# Patient Record
Sex: Female | Born: 1995 | Race: White | Hispanic: No | Marital: Married | Smoking: Current every day smoker
Health system: Southern US, Community
[De-identification: ages and names within clinical notes are randomized; demographics above are authoritative.]

## PROBLEM LIST (undated history)

## (undated) ENCOUNTER — Inpatient Hospital Stay (HOSPITAL_COMMUNITY): Payer: Self-pay

## (undated) DIAGNOSIS — E039 Hypothyroidism, unspecified: Secondary | ICD-10-CM

## (undated) DIAGNOSIS — O1490 Unspecified pre-eclampsia, unspecified trimester: Secondary | ICD-10-CM

## (undated) DIAGNOSIS — K529 Noninfective gastroenteritis and colitis, unspecified: Secondary | ICD-10-CM

## (undated) DIAGNOSIS — F419 Anxiety disorder, unspecified: Secondary | ICD-10-CM

## (undated) DIAGNOSIS — K2970 Gastritis, unspecified, without bleeding: Secondary | ICD-10-CM

## (undated) DIAGNOSIS — K589 Irritable bowel syndrome without diarrhea: Secondary | ICD-10-CM

## (undated) DIAGNOSIS — E876 Hypokalemia: Secondary | ICD-10-CM

## (undated) HISTORY — DX: Gastritis, unspecified, without bleeding: K29.70

## (undated) HISTORY — DX: Hypothyroidism, unspecified: E03.9

## (undated) HISTORY — PX: CHOLECYSTECTOMY: SHX55

## (undated) HISTORY — PX: APPENDECTOMY: SHX54

## (undated) HISTORY — DX: Irritable bowel syndrome, unspecified: K58.9

## (undated) HISTORY — DX: Noninfective gastroenteritis and colitis, unspecified: K52.9

## (undated) HISTORY — DX: Unspecified pre-eclampsia, unspecified trimester: O14.90

## (undated) HISTORY — DX: Anxiety disorder, unspecified: F41.9

---

## 2015-12-27 LAB — OB RESULTS CONSOLE GC/CHLAMYDIA
CHLAMYDIA, DNA PROBE: NEGATIVE
GC PROBE AMP, GENITAL: NEGATIVE

## 2016-01-16 ENCOUNTER — Encounter: Payer: Self-pay | Admitting: *Deleted

## 2016-01-16 NOTE — Progress Notes (Signed)
Patient had viability scan at [facility].  No dating criteria on U/S.

## 2016-01-27 LAB — OB RESULTS CONSOLE GC/CHLAMYDIA
CHLAMYDIA, DNA PROBE: NEGATIVE
Gonorrhea: NEGATIVE

## 2016-01-29 ENCOUNTER — Encounter: Payer: Self-pay | Admitting: Advanced Practice Midwife

## 2016-01-29 ENCOUNTER — Ambulatory Visit (INDEPENDENT_AMBULATORY_CARE_PROVIDER_SITE_OTHER): Payer: Self-pay | Admitting: Advanced Practice Midwife

## 2016-01-29 VITALS — BP 132/81 | HR 81 | Temp 98.4°F | Ht 63.0 in | Wt 124.6 lb

## 2016-01-29 DIAGNOSIS — Z720 Tobacco use: Secondary | ICD-10-CM | POA: Insufficient documentation

## 2016-01-29 DIAGNOSIS — O09293 Supervision of pregnancy with other poor reproductive or obstetric history, third trimester: Secondary | ICD-10-CM

## 2016-01-29 DIAGNOSIS — K589 Irritable bowel syndrome without diarrhea: Secondary | ICD-10-CM | POA: Insufficient documentation

## 2016-01-29 DIAGNOSIS — F411 Generalized anxiety disorder: Secondary | ICD-10-CM

## 2016-01-29 DIAGNOSIS — Z8751 Personal history of pre-term labor: Secondary | ICD-10-CM

## 2016-01-29 DIAGNOSIS — E039 Hypothyroidism, unspecified: Secondary | ICD-10-CM

## 2016-01-29 DIAGNOSIS — Z3492 Encounter for supervision of normal pregnancy, unspecified, second trimester: Secondary | ICD-10-CM

## 2016-01-29 DIAGNOSIS — Z3490 Encounter for supervision of normal pregnancy, unspecified, unspecified trimester: Secondary | ICD-10-CM

## 2016-01-29 DIAGNOSIS — O09299 Supervision of pregnancy with other poor reproductive or obstetric history, unspecified trimester: Secondary | ICD-10-CM | POA: Insufficient documentation

## 2016-01-29 LAB — POCT URINALYSIS DIP (DEVICE)
BILIRUBIN URINE: NEGATIVE
GLUCOSE, UA: NEGATIVE mg/dL
KETONES UR: NEGATIVE mg/dL
LEUKOCYTES UA: NEGATIVE
Nitrite: NEGATIVE
Protein, ur: NEGATIVE mg/dL
UROBILINOGEN UA: 0.2 mg/dL (ref 0.0–1.0)
pH: 5.5 (ref 5.0–8.0)

## 2016-01-29 LAB — TSH: TSH: 0.92 mIU/L (ref 0.50–4.30)

## 2016-01-29 NOTE — Progress Notes (Signed)
New OB, see Smart Set  Subjective:    Misty Mills is a G2P0101 [redacted]w[redacted]d being seen today for her first obstetrical visit.  Her obstetrical history is significant for smoker and Hx preeclampsia, hx PTL, hypothyroidism, Panic disorder, IBS, hx preterm birth due to preeclampsia. Patient does intend to breast feed. Pregnancy history fully reviewed.  Patient reports no complaints.  Filed Vitals:   01/29/16 1459 01/29/16 1500  BP: 132/81   Pulse: 81   Temp: 98.4 F (36.9 C)   Height:   (1.6 m)  Weight: 124 lb 9.6 oz (56.518 kg)     HISTORY: OB History  Gravida Para Term Preterm AB SAB TAB Ectopic Multiple Living  # Outcome Date GA Lbr Len/2nd Weight Sex Delivery Anes PTL Lv  2 Current           1 Preterm 11/05/14 [redacted]w[redacted]d  4 lb 11 oz (2.126 kg) F Vag-Spont EPI Y Y     Comments: preterm delivery; Precclampsia- born at [facility]     Past Medical History  Diagnosis Date  . Hypothyroidism   . Colitis   . Gastritis   . Preeclampsia   . Irritable bowel   . Preterm labor   . Anxiety    Past Surgical History  Procedure Laterality Date  . Appendectomy     Family History  Problem Relation Age of Onset  . Hypothyroidism Mother   . Hypertension Mother   . Mental illness Father      Exam    Uterus:  Fundal Height: 14 cm  Pelvic Exam:    Perineum: Normal Perineum   Vulva: Bartholin's, Urethra, Skene's normal   Vagina:  normal discharge   pH:    Cervix: no cervical motion tenderness   Adnexa: no mass, fullness, tenderness   Bony Pelvis: gynecoid  System: Breast:  normal appearance, no masses or tenderness   Skin: normal coloration and turgor, no rashes    Neurologic: oriented, grossly non-focal   Extremities: normal strength, tone, and muscle mass   HEENT neck supple with midline trachea   Mouth/Teeth mucous membranes moist, pharynx normal without lesions   Neck supple and no masses   Cardiovascular: regular rate and rhythm   Respiratory:  appears well, vitals normal, no respiratory distress, acyanotic, normal RR, ear and throat exam is normal   Abdomen: soft, non-tender; bowel sounds normal; no masses,  no organomegaly   Urinary: urethral meatus normal      Assessment:    Pregnancy: G2P0101 Patient Active Problem List   Diagnosis Date Noted  . IBS (irritable bowel syndrome) 01/29/2016  . Hypothyroidism 01/29/2016  . Generalized anxiety disorder 01/29/2016  . Tobacco use 01/29/2016  . Hx of preeclampsia, prior pregnancy, currently pregnant 01/29/2016  . History of pre-term labor 01/29/2016        Plan:     Initial labs drawn. Prenatal vitamins. Problem list reviewed and updated. Genetic Screening discussed Quad Screen: declined.  Ultrasound discussed; fetal survey: ordered.  Follow up in 4 weeks. 50% of 30 min visit spent on counseling and coordination of care.   Routines reviewed Will add TSH to labs Informed may need to increase Synthroid Forgot to tell her about 24 hr urine before she left, will have RN call her Form filled out for Diclegis GC/Chl and wet prep done at other hospital and were negative    [facility] 01/29/2016

## 2016-01-29 NOTE — Patient Instructions (Signed)

## 2016-01-29 NOTE — Progress Notes (Signed)
Here for first visit. Declines flu shot. Declined quad screen.

## 2016-01-30 ENCOUNTER — Telehealth: Payer: Self-pay | Admitting: General Practice

## 2016-01-30 LAB — CULTURE, OB URINE
Colony Count: NO GROWTH
ORGANISM ID, BACTERIA: NO GROWTH

## 2016-01-30 NOTE — Telephone Encounter (Signed)
Per Wynelle Bourgeois, patient needs 24 hr urine & cmet when she brings urine back due to pre-e with last pregnancy. Called patient, no answer- left message stating we are trying to reach you in regards to an appt, please call us back at the clinics

## 2016-01-31 ENCOUNTER — Encounter: Payer: Self-pay | Admitting: Advanced Practice Midwife

## 2016-01-31 ENCOUNTER — Encounter: Payer: Self-pay | Admitting: *Deleted

## 2016-01-31 DIAGNOSIS — R899 Unspecified abnormal finding in specimens from other organs, systems and tissues: Secondary | ICD-10-CM | POA: Insufficient documentation

## 2016-01-31 LAB — PRENATAL PROFILE (SOLSTAS)
ANTIBODY SCREEN: NEGATIVE
BASOS ABS: 0 10*3/uL (ref 0.0–0.1)
BASOS PCT: 0 % (ref 0–1)
EOS ABS: 0 10*3/uL (ref 0.0–0.7)
Eosinophils Relative: 0 % (ref 0–5)
HCT: 37.5 % (ref 36.0–46.0)
HEMOGLOBIN: 12.6 g/dL (ref 12.0–15.0)
HEP B S AG: NEGATIVE
HIV 1&2 Ab, 4th Generation: NONREACTIVE
LYMPHS ABS: 2.9 10*3/uL (ref 0.7–4.0)
Lymphocytes Relative: 15 % (ref 12–46)
MCH: 29.6 pg (ref 26.0–34.0)
MCHC: 33.6 g/dL (ref 30.0–36.0)
MCV: 88.2 fL (ref 78.0–100.0)
MONOS PCT: 5 % (ref 3–12)
MPV: 9.2 fL (ref 8.6–12.4)
Monocytes Absolute: 1 10*3/uL (ref 0.1–1.0)
NEUTROS ABS: 15.3 10*3/uL — AB (ref 1.7–7.7)
NEUTROS PCT: 80 % — AB (ref 43–77)
Platelets: 341 10*3/uL (ref 150–400)
RBC: 4.25 MIL/uL (ref 3.87–5.11)
RDW: 13.8 % (ref 11.5–15.5)
RPR: REACTIVE — AB
RUBELLA: 1.95 {index} — AB (ref <0.90)
Rh Type: POSITIVE
WBC: 19.1 10*3/uL — ABNORMAL HIGH (ref 4.0–10.5)

## 2016-01-31 LAB — PRESCRIPTION MONITORING PROFILE (19 PANEL)
Amphetamine/Meth: NEGATIVE ng/mL
Barbiturate Screen, Urine: NEGATIVE ng/mL
Benzodiazepine Screen, Urine: NEGATIVE ng/mL
Buprenorphine, Urine: NEGATIVE ng/mL
CREATININE, URINE: 22.29 mg/dL (ref >20.0)
Cannabinoid Scrn, Ur: NEGATIVE ng/mL
Carisoprodol, Urine: NEGATIVE ng/mL
Cocaine Metabolites: NEGATIVE ng/mL
ECSTASY: NEGATIVE ng/mL
FENTANYL URINE: NEGATIVE ng/mL
MEPERIDINE UR: NEGATIVE ng/mL
Methadone Screen, Urine: NEGATIVE ng/mL
Methaqualone: NEGATIVE ng/mL
NITRITES URINE, INITIAL: NEGATIVE ug/mL
Opiate Screen, Urine: NEGATIVE ng/mL
Oxycodone Screen, Ur: NEGATIVE ng/mL
PROPOXYPHENE: NEGATIVE ng/mL
Phencyclidine, Ur: NEGATIVE ng/mL
Tapentadol, urine: NEGATIVE ng/mL
Tramadol Scrn, Ur: NEGATIVE ng/mL
Zolpidem, Urine: NEGATIVE ng/mL
pH, Initial: 6.4 pH (ref 4.5–8.9)

## 2016-01-31 LAB — RPR TITER: RPR Titer: 1:1 {titer}

## 2016-02-02 ENCOUNTER — Other Ambulatory Visit: Payer: Self-pay | Admitting: Advanced Practice Midwife

## 2016-02-02 ENCOUNTER — Encounter: Payer: Self-pay | Admitting: Advanced Practice Midwife

## 2016-02-02 DIAGNOSIS — R899 Unspecified abnormal finding in specimens from other organs, systems and tissues: Secondary | ICD-10-CM

## 2016-02-02 DIAGNOSIS — O98112 Syphilis complicating pregnancy, second trimester: Secondary | ICD-10-CM | POA: Insufficient documentation

## 2016-02-02 LAB — FLUORESCENT TREPONEMAL AB(FTA)-IGG-BLD: FLUORESCENT TREPONEMAL ABS: REACTIVE — AB

## 2016-02-02 NOTE — Telephone Encounter (Signed)
Pt returned call and I explained to her that due to hx of pre-eclampsia then the provider has requested her to do a 24 hr urine.  Explained to the pt the procedure.  Pt stated that she will possibly be able to come in today or tomorrow for 24 hr urine supplies.

## 2016-02-03 ENCOUNTER — Other Ambulatory Visit: Payer: Medicaid Other

## 2016-02-03 ENCOUNTER — Telehealth: Payer: Self-pay

## 2016-02-03 NOTE — Telephone Encounter (Signed)
Could not put in order because it would need to be done in clinic   Dose is 2.34million units IM x 1

## 2016-02-03 NOTE — Telephone Encounter (Signed)
Left a detailed medicine stating patient needs to call us back regarding test results.

## 2016-02-03 NOTE — Telephone Encounter (Signed)
Has + RPR and FTABS   Needs PCN shot   WIll put in orders   I have called patient and left her a message to call the clinics back with important information.

## 2016-02-04 ENCOUNTER — Ambulatory Visit (INDEPENDENT_AMBULATORY_CARE_PROVIDER_SITE_OTHER): Payer: Self-pay

## 2016-02-04 ENCOUNTER — Other Ambulatory Visit: Payer: Self-pay

## 2016-02-04 VITALS — BP 125/68 | HR 91 | Temp 97.8°F

## 2016-02-04 DIAGNOSIS — O98112 Syphilis complicating pregnancy, second trimester: Secondary | ICD-10-CM

## 2016-02-04 DIAGNOSIS — Z202 Contact with and (suspected) exposure to infections with a predominantly sexual mode of transmission: Secondary | ICD-10-CM

## 2016-02-04 MED ORDER — PENICILLIN G BENZATHINE 1200000 UNIT/2ML IM SUSP
1.2000 10*6.[IU] | Freq: Once | INTRAMUSCULAR | Status: AC
  Administered 2016-02-04: 1.2 10*6.[IU] via INTRAMUSCULAR

## 2016-02-04 MED ORDER — PRENATAL VITAMINS PLUS 27-1 MG PO TABS: 1.0000 | ORAL_TABLET | Freq: Once | ORAL | Status: DC

## 2016-02-04 MED ORDER — PENICILLIN G BENZATHINE & PROC 1200000 UNIT/2ML IM SUSP: 1.2000 10*6.[IU] | Freq: Once | INTRAMUSCULAR | Status: DC

## 2016-02-04 NOTE — Telephone Encounter (Signed)
LMTCB concerning lab results.

## 2016-02-04 NOTE — Progress Notes (Signed)
Pt comes into clinics today for her 2 of 6 injection she will need to treat her positive results  for Syphilis. Per Dr.Anyanwu patients will need a dose of 2.4 for three weeks. Each injection was given at 1.2 million units in each buttocks. Pt blood pressure is stable however patient did become a little sick after getting her injection. She was encourage to sit down and wait a minute before leaving. Ice chips and ginger ale was given ton patient. Pt verbalize understanding and waited 15 minutes after injection given.

## 2016-02-05 NOTE — Telephone Encounter (Signed)
Pt has received first dose of PCN and will need to returned to clinic in 1 week. I have place a reminder call for patient to call and scheduled follow up nurse visit.

## 2016-02-06 NOTE — Telephone Encounter (Signed)
Pt has been scheduled an appt on 02/11/2016

## 2016-02-09 ENCOUNTER — Telehealth: Payer: Self-pay | Admitting: *Deleted

## 2016-02-09 ENCOUNTER — Encounter: Payer: Self-pay | Admitting: *Deleted

## 2016-02-09 NOTE — Telephone Encounter (Addendum)
Pt left message stating that she has recently been to the clinic for treatment of syphilis. Her partner was recently tested for the infection and got his results today which were negative. She wants to know how he does not have the infection since it is sexually transmitted and she knows she did not cheat with someone else. Please call back. I returned pt's call and discussed her concern. I advised that I do not have an answer to her question. Pt stated that she knows she has not been with anyone else other than her husband and he states he has not been with anyone else either. Misty Mills was present when her husband received his results today and also talked with the clinical personnel. I advised pt to discuss her concern further with a provider on 3/1 when she comes for her injection.  She agreed and voiced understanding of all information given.

## 2016-02-10 NOTE — Progress Notes (Signed)
[facility] Barr [facility] in regards to the communicable disease report that was sent.  He wanted to know how the RPR was obtained via vaginal swab.  I explained to him that the RPR was completed via blood draw and that it was a mistake on how it was written on the communicable disease report, I apologized.  I informed him that we could send the RPR results and requested that he sends a release of information.  He states that he will fax over the release of information and if we could also send H&P as well.  Front office, Erie Noe, to fax information requested via EPIC.

## 2016-02-11 ENCOUNTER — Ambulatory Visit (INDEPENDENT_AMBULATORY_CARE_PROVIDER_SITE_OTHER): Payer: Self-pay

## 2016-02-11 VITALS — BP 130/83 | HR 75 | Temp 97.9°F

## 2016-02-11 DIAGNOSIS — O98112 Syphilis complicating pregnancy, second trimester: Secondary | ICD-10-CM

## 2016-02-11 MED ORDER — PENICILLIN G BENZATHINE & PROC 900000-300000 UNIT/2ML IM SUSP: 2.4000 10*6.[IU] | Freq: Once | INTRAMUSCULAR | Status: DC

## 2016-02-11 MED ORDER — PENICILLIN G BENZATHINE 1200000 UNIT/2ML IM SUSP
1.2000 10*6.[IU] | Freq: Once | INTRAMUSCULAR | Status: AC
  Administered 2016-02-11: 1.2 10*6.[IU] via INTRAMUSCULAR

## 2016-02-11 MED ORDER — PENICILLIN G BENZATHINE 1200000 UNIT/2ML IM SUSP: 2.4000 10*6.[IU] | Freq: Once | INTRAMUSCULAR | Status: DC

## 2016-02-11 NOTE — Progress Notes (Signed)
Pt comes into clinic today for 2nd dose of Bicillin L-A given to treat her std infection. Pt was given 1.2 million units in her right and left buttocks.Pt waited 15 minutes to make sure medication was tolerated well. Pt will follow up next Wednesday for last two doses.

## 2016-02-13 ENCOUNTER — Encounter: Payer: Self-pay | Admitting: *Deleted

## 2016-02-18 ENCOUNTER — Ambulatory Visit (INDEPENDENT_AMBULATORY_CARE_PROVIDER_SITE_OTHER): Payer: Self-pay

## 2016-02-18 VITALS — BP 111/77 | HR 82 | Temp 98.5°F

## 2016-02-18 DIAGNOSIS — O98112 Syphilis complicating pregnancy, second trimester: Secondary | ICD-10-CM

## 2016-02-18 MED ORDER — PENICILLIN G BENZATHINE 1200000 UNIT/2ML IM SUSP
2.4000 10*6.[IU] | Freq: Once | INTRAMUSCULAR | Status: AC
  Administered 2016-02-18: 2.4 10*6.[IU] via INTRAMUSCULAR

## 2016-02-18 NOTE — Progress Notes (Signed)
Pt comes in today for her last penicillin injection 1.2 million given in each buttocks. Pt tolerated well. Pt has now completed three doses for the treatment of syphillis.

## 2016-02-26 ENCOUNTER — Ambulatory Visit (INDEPENDENT_AMBULATORY_CARE_PROVIDER_SITE_OTHER): Payer: Self-pay | Admitting: Family

## 2016-02-26 VITALS — BP 129/68 | HR 67 | Temp 98.5°F | Wt 130.2 lb

## 2016-02-26 DIAGNOSIS — O09293 Supervision of pregnancy with other poor reproductive or obstetric history, third trimester: Secondary | ICD-10-CM

## 2016-02-26 DIAGNOSIS — Z8751 Personal history of pre-term labor: Secondary | ICD-10-CM

## 2016-02-26 DIAGNOSIS — O98112 Syphilis complicating pregnancy, second trimester: Secondary | ICD-10-CM

## 2016-02-26 LAB — POCT URINALYSIS DIP (DEVICE)
Bilirubin Urine: NEGATIVE
GLUCOSE, UA: NEGATIVE mg/dL
Hgb urine dipstick: NEGATIVE
KETONES UR: NEGATIVE mg/dL
LEUKOCYTES UA: NEGATIVE
Nitrite: NEGATIVE
Protein, ur: NEGATIVE mg/dL
SPECIFIC GRAVITY, URINE: 1.015 (ref 1.005–1.030)
Urobilinogen, UA: 0.2 mg/dL (ref 0.0–1.0)
pH: 7 (ref 5.0–8.0)

## 2016-02-26 LAB — COMPREHENSIVE METABOLIC PANEL
ALT: 26 U/L (ref 5–32)
AST: 24 U/L (ref 12–32)
Albumin: 3.7 g/dL (ref 3.6–5.1)
Alkaline Phosphatase: 70 U/L (ref 47–176)
BUN: 4 mg/dL — ABNORMAL LOW (ref 7–20)
CALCIUM: 9 mg/dL (ref 8.9–10.4)
CHLORIDE: 105 mmol/L (ref 98–110)
CO2: 24 mmol/L (ref 20–31)
Creat: 0.53 mg/dL (ref 0.50–1.00)
GLUCOSE: 80 mg/dL (ref 65–99)
POTASSIUM: 3.7 mmol/L — AB (ref 3.8–5.1)
Sodium: 139 mmol/L (ref 135–146)
Total Bilirubin: 0.3 mg/dL (ref 0.2–1.1)
Total Protein: 6.7 g/dL (ref 6.3–8.2)

## 2016-02-26 LAB — CBC
HEMATOCRIT: 34.3 % — AB (ref 36.0–46.0)
HEMOGLOBIN: 11.9 g/dL — AB (ref 12.0–15.0)
MCH: 31 pg (ref 26.0–34.0)
MCHC: 34.7 g/dL (ref 30.0–36.0)
MCV: 89.3 fL (ref 78.0–100.0)
MPV: 8.7 fL (ref 8.6–12.4)
Platelets: 304 10*3/uL (ref 150–400)
RBC: 3.84 MIL/uL — AB (ref 3.87–5.11)
RDW: 14 % (ref 11.5–15.5)
WBC: 16.1 10*3/uL — ABNORMAL HIGH (ref 4.0–10.5)

## 2016-02-26 NOTE — Progress Notes (Signed)
Subjective:  Misty Mills is a 20 y.o. G2P0101 at 4622w6d being seen today for ongoing prenatal care.  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  Patient reports no complaints.  Contractions: Not present.  .  Movement: Present. Denies leaking of fluid.   The following portions of the patient's history were reviewed and updated as appropriate: allergies, current medications, past family history, past medical history, past social history, past surgical history and problem list. Problem list updated.  Objective:   Filed Vitals:   02/26/16 1615  BP: 129/68  Pulse: 67  Temp: 98.5 F (36.9 C)  Weight: 130 lb 3.2 oz (59.058 kg)    Fetal Status: Fetal Heart Rate (bpm): 138 Fundal Height: 17 cm Movement: Present     General:  Alert, oriented and cooperative. Patient is in no acute distress.  Skin: Skin is warm and dry. No rash noted.   Cardiovascular: Normal heart rate noted  Respiratory: Normal respiratory effort, no problems with respiration noted  Abdomen: Soft, gravid, appropriate for gestational age. Pain/Pressure: Present     Pelvic:       Cervical exam deferred        Extremities: Normal range of motion.  Edema: Trace  Mental Status: Normal mood and affect. Normal behavior. Normal judgment and thought content.   Urinalysis: Urine Protein: Negative Urine Glucose: Negative  Assessment and Plan:  Pregnancy: G2P0101 at 3622w6d  1. Hx of preeclampsia, prior pregnancy, currently pregnant, third trimester - Creatinine clearance, urine, 24 hour - Protein, urine, 24 hour - Comprehensive metabolic panel - CBC  2. History of pre-term labor - Given info on 17-p; will decide  3. Syphilis  affecting pregnancy in second trimester, antepartum - Treatment completed  General obstetric precautions including but not limited to vaginal bleeding and pelvic pain reviewed in detail with the patient. Please refer to After Visit Summary for other counseling recommendations.  Return in about 3 weeks (around 03/18/2016).   Eino FarberWalidah Kennith GainN Karim, CNM

## 2016-02-28 LAB — CREATININE CLEARANCE, URINE, 24 HOUR
CREAT CLEAR: 175 mL/min — AB (ref 75–115)
CREATININE 24H UR: 1.34 g/(24.h) (ref 0.63–2.50)
CREATININE, URINE: 74 mg/dL (ref 20–320)
Creatinine: 0.53 mg/dL (ref 0.50–1.00)

## 2016-02-28 LAB — PROTEIN, URINE, 24 HOUR
PROTEIN 24H UR: 162 mg/(24.h) — AB (ref <150)
Protein, Urine: 9 mg/dL (ref 5–24)

## 2016-03-01 ENCOUNTER — Telehealth: Payer: Self-pay | Admitting: Obstetrics & Gynecology

## 2016-03-01 NOTE — Telephone Encounter (Signed)
Patient called to get 24 hour urine test results

## 2016-03-02 NOTE — Telephone Encounter (Signed)
Left message for patient to call clinic back regarding results.

## 2016-03-03 NOTE — Telephone Encounter (Signed)
Called pt and informed of 24 hr urine test results. Pt advised that while she does have some protein in the urine it is not yet enough to be considered pre-eclampsia. We will continue to monitor her at prenatal visit. Pt voiced understanding.

## 2016-03-05 ENCOUNTER — Ambulatory Visit (HOSPITAL_COMMUNITY)
Admission: RE | Admit: 2016-03-05 | Discharge: 2016-03-05 | Disposition: A | Discharge status: Home / Self Care | Payer: Medicaid Other | Source: Ambulatory Visit | Attending: Advanced Practice Midwife | Admitting: Advanced Practice Midwife

## 2016-03-05 ENCOUNTER — Other Ambulatory Visit: Payer: Self-pay | Admitting: Advanced Practice Midwife

## 2016-03-05 DIAGNOSIS — Z3689 Encounter for other specified antenatal screening: Secondary | ICD-10-CM

## 2016-03-05 DIAGNOSIS — O99342 Other mental disorders complicating pregnancy, second trimester: Secondary | ICD-10-CM | POA: Insufficient documentation

## 2016-03-05 DIAGNOSIS — O09293 Supervision of pregnancy with other poor reproductive or obstetric history, third trimester: Secondary | ICD-10-CM

## 2016-03-05 DIAGNOSIS — O99282 Endocrine, nutritional and metabolic diseases complicating pregnancy, second trimester: Secondary | ICD-10-CM | POA: Insufficient documentation

## 2016-03-05 DIAGNOSIS — O09212 Supervision of pregnancy with history of pre-term labor, second trimester: Secondary | ICD-10-CM

## 2016-03-05 DIAGNOSIS — E039 Hypothyroidism, unspecified: Secondary | ICD-10-CM

## 2016-03-05 DIAGNOSIS — O09892 Supervision of other high risk pregnancies, second trimester: Secondary | ICD-10-CM

## 2016-03-05 DIAGNOSIS — O99332 Smoking (tobacco) complicating pregnancy, second trimester: Secondary | ICD-10-CM | POA: Diagnosis not present

## 2016-03-05 DIAGNOSIS — Z3A19 19 weeks gestation of pregnancy: Secondary | ICD-10-CM | POA: Insufficient documentation

## 2016-03-05 DIAGNOSIS — Z36 Encounter for antenatal screening of mother: Secondary | ICD-10-CM | POA: Diagnosis not present

## 2016-03-05 DIAGNOSIS — Z3490 Encounter for supervision of normal pregnancy, unspecified, unspecified trimester: Secondary | ICD-10-CM

## 2016-03-18 ENCOUNTER — Encounter: Payer: Medicaid Other | Admitting: Certified Nurse Midwife

## 2016-03-23 ENCOUNTER — Other Ambulatory Visit: Payer: Self-pay | Admitting: Obstetrics & Gynecology

## 2016-03-29 ENCOUNTER — Ambulatory Visit (INDEPENDENT_AMBULATORY_CARE_PROVIDER_SITE_OTHER): Payer: Medicaid Other | Admitting: Student

## 2016-03-29 VITALS — BP 130/82 | HR 87 | Wt 141.0 lb

## 2016-03-29 DIAGNOSIS — O98112 Syphilis complicating pregnancy, second trimester: Secondary | ICD-10-CM

## 2016-03-29 DIAGNOSIS — O09292 Supervision of pregnancy with other poor reproductive or obstetric history, second trimester: Secondary | ICD-10-CM

## 2016-03-29 DIAGNOSIS — F411 Generalized anxiety disorder: Secondary | ICD-10-CM

## 2016-03-29 LAB — POCT URINALYSIS DIP (DEVICE)
Bilirubin Urine: NEGATIVE
Glucose, UA: NEGATIVE mg/dL
Ketones, ur: NEGATIVE mg/dL
Nitrite: NEGATIVE
Protein, ur: NEGATIVE mg/dL
Specific Gravity, Urine: 1.01 (ref 1.005–1.030)
Urobilinogen, UA: 0.2 mg/dL (ref 0.0–1.0)
pH: 6.5 (ref 5.0–8.0)

## 2016-03-29 NOTE — Progress Notes (Signed)
Patient reports continuing sinus infection that has not improved; reports worsening headaches & recent difficulties with anxiety/panic attacks; also reports vaginal pressure

## 2016-03-29 NOTE — Patient Instructions (Addendum)
Safe Medications in Pregnancy   Acne: Benzoyl Peroxide Salicylic Acid  Backache/Headache: Tylenol: 2 regular strength every 4 hours OR              2 Extra strength every 6 hours  Colds/Coughs/Allergies: Benadryl (alcohol free) 25 mg every 6 hours as needed Breath right strips Claritin Cepacol throat lozenges Chloraseptic throat spray Cold-Eeze- up to three times per day Cough drops, alcohol free Flonase  Guaifenesin Mucinex Robitussin DM (plain only, alcohol free) Saline nasal spray/drops Sudafed (pseudoephedrine) & Actifed ** use only after [redacted] weeks gestation and if you do not have high blood pressure Tylenol Vicks Vaporub Zinc lozenges Zyrtec   Constipation: Colace Ducolax suppositories Fleet enema Glycerin suppositories Metamucil Milk of magnesia Miralax Senokot Smooth move tea  Diarrhea: Kaopectate Imodium A-D  *NO pepto Bismol  Hemorrhoids: Anusol Anusol HC Preparation H Tucks  Indigestion: Tums Maalox Mylanta Zantac  Pepcid  Insomnia: Benadryl (alcohol free)  every 6 hours as needed Tylenol PM Unisom, no Gelcaps  Leg Cramps: Tums MagGel  Nausea/Vomiting:  Bonine Dramamine Emetrol Ginger extract Sea bands Meclizine  Nausea medication to take during pregnancy:  Unisom (doxylamine succinate 25 mg tablets) Take one tablet daily at bedtime. If symptoms are not adequately controlled, the dose can be increased to a maximum recommended dose of two tablets daily (1/2 tablet in the morning, 1/2 tablet mid-afternoon and one at bedtime). Vitamin B6  tablets. Take one tablet twice a day (up to 200 mg per day).  Skin Rashes: Aveeno products Benadryl cream or  every 6 hours as needed Calamine Lotion 1% cortisone cream  Yeast infection: Gyne-lotrimin 7 Monistat 7  Gum/tooth pain: Anbesol  **If taking multiple medications, please check labels to avoid duplicating the same active ingredients **take medication as directed  on the label ** Do not exceed 4000 mg of tylenol in 24 hours **Do not take medications that contain aspirin or ibuprofen        Preterm Labor Information Preterm labor is when labor starts at less than 37 weeks of pregnancy. The normal length of a pregnancy is 39 to 41 weeks. CAUSES Often, there is no identifiable underlying cause as to why a woman goes into preterm labor. One of the most common known causes of preterm labor is infection. Infections of the uterus, cervix, vagina, amniotic sac, bladder, kidney, or even the lungs (pneumonia) can cause labor to start. Other suspected causes of preterm labor include:   Urogenital infections, such as yeast infections and bacterial vaginosis.   Uterine abnormalities (uterine shape, uterine septum, fibroids, or bleeding from the placenta).   A cervix that has been operated on (it may fail to stay closed).   Malformations in the fetus.   Multiple gestations (twins, triplets, and so on).   Breakage of the amniotic sac.  RISK FACTORS  Having a previous history of preterm labor.   Having premature rupture of membranes (PROM).   Having a placenta that covers the opening of the cervix (placenta previa).   Having a placenta that separates from the uterus (placental abruption).   Having a cervix that is too weak to hold the fetus in the uterus (incompetent cervix).   Having too much fluid in the amniotic sac (polyhydramnios).   Taking illegal drugs or smoking while pregnant.   Not gaining enough weight while pregnant.   Being younger than 83 and older than 20 years old.   Having a low socioeconomic status.   Being African American. SYMPTOMS Signs and symptoms  of preterm labor include:   Menstrual-like cramps, abdominal pain, or back pain.  Uterine contractions that are regular, as frequent as six in an hour, regardless of their intensity (may be mild or painful).  Contractions that start on the top of the  uterus and spread down to the lower abdomen and back.   A sense of increased pelvic pressure.   A watery or bloody mucus discharge that comes from the vagina.  TREATMENT Depending on the length of the pregnancy and other circumstances, your health care provider may suggest bed rest. If necessary, there are medicines that can be given to stop contractions and to mature the fetal lungs. If labor happens before 34 weeks of pregnancy, a prolonged hospital stay may be recommended. Treatment depends on the condition of both you and the fetus.  WHAT SHOULD YOU DO IF YOU THINK YOU ARE IN PRETERM LABOR? Call your health care provider right away. You will need to go to the hospital to get checked immediately. HOW CAN YOU PREVENT PRETERM LABOR IN FUTURE PREGNANCIES? You should:   Stop smoking if you smoke.  Maintain healthy weight gain and avoid chemicals and drugs that are not necessary.  Be watchful for any type of infection.  Inform your health care provider if you have a known history of preterm labor.   This information is not intended to replace advice given to you by your health care provider. Make sure you discuss any questions you have with your health care provider.   Document Released: 02/19/2004 Document Revised: 08/01/2013 Document Reviewed: 01/01/2013 Elsevier Interactive Patient Education [employer]2016 Elsevier Inc.

## 2016-03-29 NOTE — Progress Notes (Signed)
Subjective:  Misty Mills is a 20 y.o. G2P0101 at 4481w3d being seen today for ongoing prenatal care.  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  Patient reports sore throat, sinus pressure, productive cough. Contractions: Not present. Vag. Bleeding: None.  Movement: Present. Denies leaking of fluid.  Symptoms began 2 weeks ago. Denies CP, SOB, fever/chills, ear pain. Has taken some benadryl with no relief.  The following portions of the patient's history were reviewed and updated as appropriate: allergies, current medications, past family history, past medical history, past social history, past surgical history and problem list. Problem list updated.  Objective:   Filed Vitals:   03/29/16 1451 03/29/16 1456  BP: 146/94 130/82  Pulse: 105 87  Weight: 141 lb (63.957 kg)     Fetal Status: Fetal Heart Rate (bpm): 154 Fundal Height: 21 cm Movement: Present     General:  Alert, oriented and cooperative. Patient is in no acute distress.  Skin: Skin is warm and dry. No rash noted.   HEENT: Ears - normal tympanic membrane  Maxillary & frontal sinus tenderness Nasal mucosa erythematous & edematous Mouth - no exudate, no erythema, no tonsillar swelling. Uvula midline  Cardiovascular: Normal heart rate noted  Respiratory: Normal respiratory effort, no problems with respiration noted  Abdomen: Soft, gravid, appropriate for gestational age. Pain/Pressure: Present     Pelvic: Vag. Bleeding: None Vag D/C Character: White   Cervical exam deferred        Extremities: Normal range of motion.  Edema: Trace  Mental Status: Normal mood and affect. Normal behavior. Normal judgment and thought content.    Urinalysis: Urine Protein: Negative Urine Glucose: Negative  Assessment and Plan:  Pregnancy: G2P0101 at 2381w3d  1. Hx of preeclampsia, prior pregnancy, currently pregnant, second trimester  - US MFM OB FOLLOW UP; Future   Preterm labor symptoms and general obstetric precautions including but not limited to vaginal bleeding, contractions, leaking of fluid and fetal movement were reviewed in detail with the patient. Please refer to After Visit Summary for other counseling recommendations. Discussed used of OTC medications to treat URI symptoms. Discussed reasons to follow up if symptoms don't improve or worsen.  Return in about 4 weeks (around 04/26/2016) for Routine OB.   Judeth HornErin Marietta Sikkema, NP

## 2016-04-13 ENCOUNTER — Encounter (HOSPITAL_COMMUNITY): Payer: Self-pay

## 2016-04-13 ENCOUNTER — Ambulatory Visit (HOSPITAL_COMMUNITY)
Admission: RE | Admit: 2016-04-13 | Discharge: 2016-04-13 | Disposition: A | Discharge status: Home / Self Care | Payer: Medicaid Other | Source: Ambulatory Visit | Attending: Family | Admitting: Family

## 2016-04-13 ENCOUNTER — Other Ambulatory Visit: Payer: Self-pay | Admitting: General Practice

## 2016-04-13 DIAGNOSIS — Z3A24 24 weeks gestation of pregnancy: Secondary | ICD-10-CM

## 2016-04-13 DIAGNOSIS — O99282 Endocrine, nutritional and metabolic diseases complicating pregnancy, second trimester: Secondary | ICD-10-CM | POA: Insufficient documentation

## 2016-04-13 DIAGNOSIS — O09892 Supervision of other high risk pregnancies, second trimester: Secondary | ICD-10-CM

## 2016-04-13 DIAGNOSIS — Z36 Encounter for antenatal screening of mother: Secondary | ICD-10-CM | POA: Diagnosis not present

## 2016-04-13 DIAGNOSIS — E039 Hypothyroidism, unspecified: Secondary | ICD-10-CM

## 2016-04-13 DIAGNOSIS — IMO0002 Reserved for concepts with insufficient information to code with codable children: Secondary | ICD-10-CM

## 2016-04-13 DIAGNOSIS — O99332 Smoking (tobacco) complicating pregnancy, second trimester: Secondary | ICD-10-CM | POA: Diagnosis present

## 2016-04-13 DIAGNOSIS — O98112 Syphilis complicating pregnancy, second trimester: Secondary | ICD-10-CM

## 2016-04-13 DIAGNOSIS — O99342 Other mental disorders complicating pregnancy, second trimester: Secondary | ICD-10-CM

## 2016-04-13 DIAGNOSIS — O09212 Supervision of pregnancy with history of pre-term labor, second trimester: Secondary | ICD-10-CM | POA: Insufficient documentation

## 2016-04-13 DIAGNOSIS — Z0489 Encounter for examination and observation for other specified reasons: Secondary | ICD-10-CM

## 2016-04-13 DIAGNOSIS — O09292 Supervision of pregnancy with other poor reproductive or obstetric history, second trimester: Secondary | ICD-10-CM

## 2016-04-14 ENCOUNTER — Other Ambulatory Visit (HOSPITAL_COMMUNITY): Payer: Self-pay | Admitting: *Deleted

## 2016-04-14 ENCOUNTER — Telehealth: Payer: Self-pay | Admitting: *Deleted

## 2016-04-14 DIAGNOSIS — O09299 Supervision of pregnancy with other poor reproductive or obstetric history, unspecified trimester: Secondary | ICD-10-CM

## 2016-04-14 NOTE — Telephone Encounter (Signed)
Received a Engineer, technical salesvoicemail [facility] [place] [facility]: diclegis- need collaboration physician for  Misty HornErin Mills , NP.    Called number and heard message is closed until 8am CST.  Left a message we are returning your call- please call us back.

## 2016-04-19 NOTE — Telephone Encounter (Signed)
[facility] just need supervision physician, - given name of Dr. Tinnie Gensanya Pratt.

## 2016-04-27 ENCOUNTER — Ambulatory Visit (INDEPENDENT_AMBULATORY_CARE_PROVIDER_SITE_OTHER): Payer: BLUE CROSS/BLUE SHIELD | Admitting: Advanced Practice Midwife

## 2016-04-27 VITALS — BP 124/80 | HR 73 | Wt 144.7 lb

## 2016-04-27 DIAGNOSIS — Z3482 Encounter for supervision of other normal pregnancy, second trimester: Secondary | ICD-10-CM

## 2016-04-27 DIAGNOSIS — O99282 Endocrine, nutritional and metabolic diseases complicating pregnancy, second trimester: Secondary | ICD-10-CM

## 2016-04-27 DIAGNOSIS — R12 Heartburn: Principal | ICD-10-CM

## 2016-04-27 DIAGNOSIS — IMO0001 Reserved for inherently not codable concepts without codable children: Secondary | ICD-10-CM

## 2016-04-27 DIAGNOSIS — O26892 Other specified pregnancy related conditions, second trimester: Secondary | ICD-10-CM

## 2016-04-27 DIAGNOSIS — E039 Hypothyroidism, unspecified: Secondary | ICD-10-CM

## 2016-04-27 DIAGNOSIS — Z113 Encounter for screening for infections with a predominantly sexual mode of transmission: Secondary | ICD-10-CM

## 2016-04-27 DIAGNOSIS — F411 Generalized anxiety disorder: Secondary | ICD-10-CM

## 2016-04-27 DIAGNOSIS — O09212 Supervision of pregnancy with history of pre-term labor, second trimester: Secondary | ICD-10-CM

## 2016-04-27 DIAGNOSIS — O99342 Other mental disorders complicating pregnancy, second trimester: Secondary | ICD-10-CM

## 2016-04-27 DIAGNOSIS — O0992 Supervision of high risk pregnancy, unspecified, second trimester: Secondary | ICD-10-CM

## 2016-04-27 LAB — POCT URINALYSIS DIP (DEVICE)
Bilirubin Urine: NEGATIVE
Glucose, UA: NEGATIVE mg/dL
KETONES UR: NEGATIVE mg/dL
Leukocytes, UA: NEGATIVE
Nitrite: NEGATIVE
PH: 6.5 (ref 5.0–8.0)
PROTEIN: NEGATIVE mg/dL
SPECIFIC GRAVITY, URINE: 1.01 (ref 1.005–1.030)
Urobilinogen, UA: 0.2 mg/dL (ref 0.0–1.0)

## 2016-04-27 MED ORDER — ONDANSETRON HCL 4 MG PO TABS: ORAL_TABLET | ORAL | Status: DC

## 2016-04-27 MED ORDER — RANITIDINE HCL 150 MG PO TABS
150.0000 mg | ORAL_TABLET | Freq: Two times a day (BID) | ORAL | Status: DC
Start: 2016-04-27 — End: 2016-08-30

## 2016-04-27 NOTE — Patient Instructions (Signed)
Dyspareunia Dyspareunia is pain during sexual intercourse. It is most common in women, but it also happens in men.  CAUSES  Female The pain from this condition is usually felt when anything is put into the vagina, but any part of the genitals may cause pain during sex. Even sitting or wearing pants can cause pain. Sometimes, a cause cannot be found. Some causes of pain during intercourse are:  Infections of the skin around the vagina.  Vaginal infections, such as a yeast, bacterial, or viral infection.  Vaginismus. This is the inability to have anything put in the vagina even when the woman wants it to happen. There is an automatic muscle contraction and pain. The pain of the muscle contraction can be so severe that intercourse is impossible.  Allergic reaction from spermicides, semen, condoms, scented tampons, soaps, douches, and vaginal sprays.  A fluid-filled sac (cyst) on the Bartholin or Skene glands, located at the opening of the vagina.  Scar tissue in the vagina from a surgically enlarged opening (episiotomy) or tearing after delivering a baby.  Vaginal dryness. This is more common in menopause. The normal secretions of the vagina are decreased. Changes in estrogen levels and increased difficulty becoming aroused can cause painful sex. Vaginal dryness can also happen when taking birth control pills.  Thinning of the tissue (atrophy) of the vulva and vagina. This makes the area thinner, smaller, unable to stretch to accommodate a penis, and prone to infection and tearing.  Vulvar vestibulitis or vestibulodynia.This is a condition that causes pain involving the area around the entrance to the vagina.The most common cause in young women is birth control pills.Women with low estrogen levels (postmenopausal women) may also experience this.Other causes include allergic reactions, too many nerve endings, skin conditions, and pelvic muscles that cannot relax.  Vulvar dermatoses. This  includes skin conditions such as lichen sclerosus and lichen planus.  Lack of foreplay to lubricate the vagina. This can cause vaginal dryness.  Noncancerous tumors (fibroids) in the uterus.  Uterus lining tissue growing outside the uterus (endometriosis).  Pregnancy that starts in the fallopian tube (tubal pregnancy).  Pregnancy or breastfeeding your baby. This can cause vaginal dryness.  A tilting or prolapse of the uterus. Prolapse is when weak and stretched muscles around the uterus allow it to fall into the vagina.  Problems with the ovaries, cysts, or scar tissue. This may be worse with certain sexual positions.  Previous surgeries causing adhesions or scar tissue in the vagina or pelvis.  Bladder and intestinal problems.  Psychological problems (such as depression or anxiety). This may make pain worse.  Negative attitudes about sex, experiencing rape, sexual assault, and misinformation about sex. These issues are often related to some types of pain.  Previous pelvic infection, causing scar tissue in the pelvis and on the female organs.  Cyst or tumor on the ovary.  Cancer of the female organs.  Certain medicines.  Medical problems such as diabetes, arthritis, or thyroid disease. Female In men, there are many physical causes of sexual discomfort. Some causes of pain during intercourse are:  Infections of the prostate, bladder, or seminal vesicles. This can cause pain after ejaculation.  An inflamed bladder (interstitial cystitis). This may cause pain from ejaculation.  Gonorrheal infections. This may cause pain during ejaculation.  An inflamed urethra (urethritis) or inflamed prostate (prostatitis). This can make genital stimulation painful or uncomfortable.  Deformities of the penis, such as Peyronie's disease.  A tight foreskin.  Cancer of the female organs.    Psychological problems. This may make pain worse. DIAGNOSIS   Your caregiver will take a history and  have you describe where the pain is located (outside the vagina, in the vagina, in the pelvis). You may be asked when you experience pain, such as with penetration or with thrusting.  Following this, your caregiver will do a physical exam. Let your caregiver know if the exam is too painful.  During the final part of the female exam, your caregiver will feel your uterus and ovaries with one hand on the abdomen and one finger in your vagina. This is a pelvic exam.  Blood tests, a Pap test, cultures for infection, an ultrasound test, and X-rays may be done. You may need to see a specialist for female problems (gynecologist).  Your caregiver may do a CT scan, MRI, or laparoscopy. Laparoscopy is a procedure to look into the pelvis with a lighted tube, through a cut (incision) in the abdomen. TREATMENT  Your caregiver can help you determine the best course of treatment. Sometimes, more testing is done. Continue with the suggested testing until your caregiver feels sure about your diagnosis and how to treat it. Sometimes, it is difficult to find the reason for the pain. The search for the cause and treatment can be frustrating. Treatment often takes several weeks to a few months before you notice any improvement. You may also need to avoid sexual activity until symptoms improve.Continuing to have sex when it hurts can delay healing and actually make the problem worse. The treatment depends on the cause of the pain. Treatment may include:  Medicines such as antibiotics, vaginal or skin creams, hormones, or antidepressants.  Minor or major surgery.  Psychological counseling or group therapy.  Kegel exercises and vaginal dilators to help certain cases of vaginismus (spasms). Do this only if recommended by your caregiver.Kegel exercises can make some problems worse.  Applying lubrication as recommended by your caregiver if you have dryness.  Sex therapy for you and your sex partner. It is common for  the pain to continue after the reason for the pain has been treated. Some reasons for this include a conditioned response. This means the person having the pain becomes so familiar with the pain that the pain continues as a response, even though the cause is removed. Sex therapy can help with this problem. HOME CARE INSTRUCTIONS   Follow your caregiver's instructions about taking medicines, tests, counseling, and follow-up treatment.  Do not use scented tampons, douches, vaginal sprays, or soaps.  Use water-based lubricants for dryness. Oil lubricants can cause irritation.  Do not use spermicides or condoms that irritate you.  Openly discuss with your partner your sexual experience, your desires, foreplay, and different sexual positions for a more comfortable and enjoyable sexual relationship.  Join group sessions for therapy, if needed.  Practice safe sex at all times.  Empty your bladder before having intercourse.  Try different positions during sexual intercourse.  Take over-the-counter pain medicine recommended by your caregiver before having sexual intercourse.  Do not wear pantyhose. Knee-high and thigh-high hose are okay.  Avoid scrubbing your vulva with a washcloth. Wash the area gently and pat dry with a towel. SEEK MEDICAL CARE IF:   You develop vaginal bleeding after sexual intercourse.  You develop a lump at the opening of your vagina, even if it is not painful.  You have abnormal vaginal discharge.  You have vaginal dryness.  You have itching or irritation of the vulva or vagina.  You   develop a rash or reaction to your medicine. SEEK IMMEDIATE MEDICAL CARE IF:   You develop severe abdominal pain during or shortly after sexual intercourse. You could have a ruptured ovarian cyst or ruptured tubal pregnancy.  You have a fever.  You have painful or bloody urination.  You have painful sexual intercourse, and you never had it before.  You pass out after having  sexual intercourse.   This information is not intended to replace advice given to you by your health care provider. Make sure you discuss any questions you have with your health care provider.   Document Released: 12/19/2007 Document Revised: 02/21/2012 Document Reviewed: 07/01/2015 Elsevier Interactive Patient Education 2016 [employer]  Heartburn During Pregnancy Heartburn is a burning sensation in the chest caused by stomach acid backing up into the esophagus. Heartburn is common in pregnancy because a certain hormone (progesterone) is released when a woman is pregnant. The progesterone hormone may relax the valve that separates the esophagus from the stomach. This allows acid to go up into the esophagus, causing heartburn. Heartburn may also happen in pregnancy because the enlarging uterus pushes up on the stomach, which pushes more acid into the esophagus. This is especially true in the later stages of pregnancy. Heartburn problems usually go away after giving birth. CAUSES  Heartburn is caused by stomach acid backing up into the esophagus. During pregnancy, this may result from various things, including:   The progesterone hormone.  Changing hormone levels.  The growing uterus pushing stomach acid upward.  Large meals.  Certain foods and drinks.  Exercise.  Increased acid production. SIGNS AND SYMPTOMS   Burning pain in the chest or lower throat.  Bitter taste in the mouth.  Coughing. DIAGNOSIS  Your health care provider will typically diagnose heartburn by taking a careful history of your concern. Blood tests may be done to check for a certain type of bacteria that is associated with heartburn. Sometimes, heartburn is diagnosed by prescribing a heartburn medicine to see if the symptoms improve. In some cases, a procedure called an endoscopy may be done. In this procedure, a tube with a light and a camera on the end (endoscope) is used to examine the esophagus and the  stomach. TREATMENT  Treatment will vary depending on the severity of your symptoms. Your health care provider may recommend:  Over-the-counter medicines (antacids, acid reducers) for mild heartburn.  Prescription medicines to decrease stomach acid or to protect your stomach lining.  Certain changes in your diet.  Elevating the head of your bed by putting blocks under the legs. This helps prevent stomach acid from backing up into the esophagus when you are lying down. HOME CARE INSTRUCTIONS   Only take over-the-counter or prescription medicines as directed by your health care provider.  Raise the head of your bed by putting blocks under the legs if instructed to do so by your health care provider. Sleeping with more pillows is not effective because it only changes the position of your head.  Do not exercise right after eating.  Avoid eating 2-3 hours before bed. Do not lie down right after eating.  Eat small meals throughout the day instead of three large meals.  Identify foods and beverages that make your symptoms worse and avoid them. Foods you may want to avoid include:  Peppers.  Chocolate.  High-fat foods, including fried foods.  Spicy foods.  Garlic and onions.  Citrus fruits, including oranges, grapefruit, lemons, and limes.  Food containing tomatoes or tomato  products.  Mint.  Carbonated and caffeinated drinks.  Vinegar. SEEK MEDICAL CARE IF:  You have abdominal pain of any kind.  You feel burning in your upper abdomen or chest, especially after eating or lying down.  You have nausea and vomiting.  Your stomach feels upset after you eat. SEEK IMMEDIATE MEDICAL CARE IF:   You have severe chest pain that goes down your arm or into your jaw or neck.  You feel sweaty, dizzy, or light-headed.  You become short of breath.  You vomit blood.  You have difficulty or pain with swallowing.  You have bloody or black, tarry stools.  You have episodes of  heartburn more than 3 times a week, for more than 2 weeks. MAKE SURE YOU:  Understand these instructions.  Will watch your condition.  Will get help right away if you are not doing well or get worse.   This information is not intended to replace advice given to you by your health care provider. Make sure you discuss any questions you have with your health care provider.   Document Released: 11/26/2000 Document Revised: 12/20/2014 Document Reviewed: 07/18/2013 Elsevier Interactive Patient Education [employer]

## 2016-04-27 NOTE — Addendum Note (Signed)
Addended by: Gerome ApleyZEYFANG, Laiyla Slagel L on: 04/27/2016 04:14 PM   Modules accepted: Orders

## 2016-04-27 NOTE — Progress Notes (Signed)
Subjective:  Misty Mills is a 20 y.o. G2P0101 at 5621w4d being seen today for ongoing prenatal care.  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  Patient reports painful intercourse x 5-6 months, increase in vaginal discharge and irritation in recent weeks, and daily hearburn.  Contractions: Not present.  .  Movement: Present. Denies leaking of fluid.   The following portions of the patient's history were reviewed and updated as appropriate: allergies, current medications, past family history, past medical history, past social history, past surgical history and problem list. Problem list updated.  Objective:   Filed Vitals:   04/27/16 1419  BP: 124/80  Pulse: 73  Weight: 144 lb 11.2 oz (65.635 kg)    Fetal Status: Fetal Heart Rate (bpm): 154 Fundal Height: 26 cm Movement: Present     General:  Alert, oriented and cooperative. Patient is in no acute distress.  Skin: Skin is warm and dry. No rash noted.   Cardiovascular: Normal heart rate noted  Respiratory: Normal respiratory effort, no problems with respiration noted  Abdomen: Soft, gravid, appropriate for gestational age. Pain/Pressure: Present     Pelvic:       Cervical exam performed Dilation: Closed Effacement (%): Thick Station: Ballotable  Extremities: Normal range of motion.     Mental Status: Normal mood and affect. Normal behavior. Normal judgment and thought content.   Urinalysis: Urine Protein: Negative Urine Glucose: Negative  Assessment and Plan:  Pregnancy: G2P0101 at 6021w4d  1. Supervision of high risk pregnancy in second trimester  - POCT urinalysis dip (device) - Culture, OB Urine - Take antiemetics prior to next  appointment r/t glucose test.  Pt reports vomiting glucose drink with last pregnancy.   --ondansetron (ZOFRAN) 4 MG tablet; Take 1-2 tablets the morning of your appointment.  Dispense: 2 tablet; Refill: 0  2. Heartburn during pregnancy in second trimester, antepartum  - ranitidine (ZANTAC) 150 MG tablet; Take 1 tablet (150 mg total) by mouth 2 (two) times daily.  Dispense: 60 tablet; Refill: 3  3. Dyspareunia (not due to a general medical condition) --Pain with onset early pregnancy. Pelvic exam wnl today except CMT.  No evidence of PID with no abdominal pain, no fever and no acute onset of pain.  Wet prep, GCC pending.  May be discomfort r/t pregnancy without other cause.  4. Generalized anxiety disorder --Well controlled on Lexapro.  Preterm labor symptoms and general obstetric precautions including but not limited to vaginal bleeding, contractions, leaking of fluid and fetal movement were reviewed in detail with the patient. Please refer to After Visit Summary for other counseling recommendations.  Return in about 2 weeks (around 05/11/2016).   [place]urshel PartyLisa A Leftwich-Kirby, CNM

## 2016-04-27 NOTE — Addendum Note (Signed)
Addended by: Gerome ApleyZEYFANG, Dorotea Hand L on: 04/27/2016 04:19 PM   Modules accepted: Orders

## 2016-04-27 NOTE — Addendum Note (Signed)
Addended by: Gerome ApleyZEYFANG, Jhanvi Drakeford L on: 04/27/2016 04:13 PM   Modules accepted: Orders

## 2016-04-27 NOTE — Progress Notes (Signed)
Pt stated she is having a lot of heart burn.

## 2016-04-28 LAB — WET PREP, GENITAL
TRICH WET PREP: NONE SEEN
Yeast Wet Prep HPF POC: NONE SEEN

## 2016-04-28 LAB — GC/CHLAMYDIA PROBE AMP (~~LOC~~) NOT AT ARMC
Chlamydia: NEGATIVE
NEISSERIA GONORRHEA: NEGATIVE

## 2016-04-29 LAB — CULTURE, OB URINE
Colony Count: NO GROWTH
ORGANISM ID, BACTERIA: NO GROWTH

## 2016-05-03 ENCOUNTER — Other Ambulatory Visit: Payer: Self-pay | Admitting: Family

## 2016-05-03 ENCOUNTER — Telehealth: Payer: Self-pay | Admitting: General Practice

## 2016-05-03 DIAGNOSIS — N760 Acute vaginitis: Principal | ICD-10-CM

## 2016-05-03 DIAGNOSIS — B9689 Other specified bacterial agents as the cause of diseases classified elsewhere: Secondary | ICD-10-CM

## 2016-05-03 MED ORDER — METRONIDAZOLE 500 MG PO TABS: 500.0000 mg | ORAL_TABLET | Freq: Two times a day (BID) | ORAL | Status: DC

## 2016-05-03 NOTE — Telephone Encounter (Signed)
Called patient regarding BV on wet prep & flagyl sent to pharmacy. No answer- left message that we are trying to reach you with results, please call us back at the clinics

## 2016-05-03 NOTE — Telephone Encounter (Signed)
Patient called back into front office & I informed her of results & medication sent to pharmacy. Patient verbalized understanding & had no questions

## 2016-05-12 ENCOUNTER — Ambulatory Visit (INDEPENDENT_AMBULATORY_CARE_PROVIDER_SITE_OTHER): Payer: BLUE CROSS/BLUE SHIELD | Admitting: Obstetrics and Gynecology

## 2016-05-12 VITALS — BP 125/80 | HR 90 | Wt 147.6 lb

## 2016-05-12 DIAGNOSIS — O99323 Drug use complicating pregnancy, third trimester: Secondary | ICD-10-CM

## 2016-05-12 DIAGNOSIS — F411 Generalized anxiety disorder: Secondary | ICD-10-CM

## 2016-05-12 DIAGNOSIS — E039 Hypothyroidism, unspecified: Secondary | ICD-10-CM

## 2016-05-12 DIAGNOSIS — O99333 Smoking (tobacco) complicating pregnancy, third trimester: Secondary | ICD-10-CM

## 2016-05-12 DIAGNOSIS — K589 Irritable bowel syndrome without diarrhea: Secondary | ICD-10-CM

## 2016-05-12 DIAGNOSIS — O09213 Supervision of pregnancy with history of pre-term labor, third trimester: Secondary | ICD-10-CM

## 2016-05-12 DIAGNOSIS — O99283 Endocrine, nutritional and metabolic diseases complicating pregnancy, third trimester: Secondary | ICD-10-CM

## 2016-05-12 DIAGNOSIS — F419 Anxiety disorder, unspecified: Secondary | ICD-10-CM

## 2016-05-12 DIAGNOSIS — F17210 Nicotine dependence, cigarettes, uncomplicated: Secondary | ICD-10-CM

## 2016-05-12 DIAGNOSIS — Z8751 Personal history of pre-term labor: Secondary | ICD-10-CM

## 2016-05-12 DIAGNOSIS — Z720 Tobacco use: Secondary | ICD-10-CM

## 2016-05-12 DIAGNOSIS — O09292 Supervision of pregnancy with other poor reproductive or obstetric history, second trimester: Secondary | ICD-10-CM

## 2016-05-12 DIAGNOSIS — O98112 Syphilis complicating pregnancy, second trimester: Secondary | ICD-10-CM

## 2016-05-12 LAB — CBC
HEMATOCRIT: 32.3 % — AB (ref 35.0–45.0)
Hemoglobin: 10.9 g/dL — ABNORMAL LOW (ref 11.7–15.5)
MCH: 30.4 pg (ref 27.0–33.0)
MCHC: 33.7 g/dL (ref 32.0–36.0)
MCV: 90.2 fL (ref 80.0–100.0)
MPV: 9.2 fL (ref 7.5–12.5)
Platelets: 219 10*3/uL (ref 140–400)
RBC: 3.58 MIL/uL — ABNORMAL LOW (ref 3.80–5.10)
RDW: 12.7 % (ref 11.0–15.0)
WBC: 18.8 10*3/uL — AB (ref 3.8–10.8)

## 2016-05-12 LAB — POCT URINALYSIS DIP (DEVICE)
Bilirubin Urine: NEGATIVE
GLUCOSE, UA: NEGATIVE mg/dL
Hgb urine dipstick: NEGATIVE
KETONES UR: NEGATIVE mg/dL
Nitrite: NEGATIVE
PROTEIN: NEGATIVE mg/dL
SPECIFIC GRAVITY, URINE: 1.01 (ref 1.005–1.030)
UROBILINOGEN UA: 0.2 mg/dL (ref 0.0–1.0)
pH: 6.5 (ref 5.0–8.0)

## 2016-05-12 NOTE — Progress Notes (Signed)
-   Declines Tdap

## 2016-05-12 NOTE — Progress Notes (Signed)
Subjective:  Misty Mills is a 20 y.o. G2P0101 at 6278w5d being seen today for ongoing prenatal care.  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  Patient reports no complaints.  The following portions of the patient's history were reviewed and updated as appropriate: allergies, current medications, past family history, past medical history, past social history, past surgical history and problem list. Problem list updated.  Objective:   Filed Vitals:   05/12/16 1410  BP: 125/80  Pulse: 90  Weight: 147 lb 9.6 oz (66.951 kg)    Fetal Status:     Movement: Present     General:  Alert, oriented and cooperative. Patient is in no acute distress.  Skin: Skin is warm and dry. No rash noted.   Cardiovascular: Normal heart rate noted  Respiratory: Normal respiratory effort, no problems with respiration noted  Abdomen: Soft, gravid, appropriate for gestational age. Pain/Pressure: Neg  Pelvic: deferred  Extremities: Normal range of motion.  Edema: None  Mental Status: Normal mood and affect. Normal behavior. Normal judgment and thought content.   Urinalysis: Urine Protein: Negative Urine Glucose: Negative  Assessment and Plan:  Pregnancy: G2P0101 at 2078w5d  1. Hx of preeclampsia, prior pregnancy, currently pregnant, second trimester -routine OB care - Glucose Tolerance, 1 HR (50g) w/o Fasting - CBC - HIV antibody (with reflex)  2. History of pre-term labor Declined 17p this pregnancy. No current s/s  3. 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 S/p 3wk PCN tx this pregnancy. 1:1 titer on 2/16. Negative anatomy scan  4. Tobacco use No current  issue  5. Generalized anxiety disorder Well controlled on lexapro  6. Hypothyroidism, unspecified hypothyroidism type No meds. Screening rpt TSH today  7. IBS (irritable bowel syndrome) No issues  Preterm labor symptoms and general obstetric precautions including but not limited to vaginal bleeding, contractions, leaking of fluid and fetal movement were reviewed in detail with the patient. Please refer to After Visit Summary for other counseling recommendations.  Return in about 2 weeks (around 05/26/2016) for rob.   [place] Bingharlie Kayleann Mccaffery, MD

## 2016-05-13 LAB — HIV ANTIBODY (ROUTINE TESTING W REFLEX): HIV: NONREACTIVE

## 2016-05-13 LAB — GLUCOSE TOLERANCE, 1 HOUR (50G) W/O FASTING: GLUCOSE, 1 HR, GESTATIONAL: 146 mg/dL — AB (ref <140)

## 2016-05-13 LAB — TSH: TSH: 1.13 mIU/L (ref 0.50–4.30)

## 2016-05-14 LAB — RPR TITER: RPR Titer: 1:1 {titer}

## 2016-05-14 LAB — FLUORESCENT TREPONEMAL AB(FTA)-IGG-BLD: FLUORESCENT TREPONEMAL ABS: NONREACTIVE

## 2016-05-14 LAB — RPR: RPR Ser Ql: REACTIVE — AB

## 2016-05-18 ENCOUNTER — Telehealth: Payer: Self-pay | Admitting: Family Medicine

## 2016-05-18 NOTE — Telephone Encounter (Signed)
Calling to get lab results 

## 2016-05-20 ENCOUNTER — Inpatient Hospital Stay (HOSPITAL_COMMUNITY)
Admission: AD | Admit: 2016-05-20 | Discharge: 2016-05-20 | Disposition: A | Discharge status: Home / Self Care | Payer: Medicaid Other | Source: Ambulatory Visit | Attending: Obstetrics & Gynecology | Admitting: Obstetrics & Gynecology

## 2016-05-20 ENCOUNTER — Encounter (HOSPITAL_COMMUNITY): Payer: Self-pay

## 2016-05-20 DIAGNOSIS — O479 False labor, unspecified: Secondary | ICD-10-CM

## 2016-05-20 DIAGNOSIS — F419 Anxiety disorder, unspecified: Secondary | ICD-10-CM | POA: Diagnosis not present

## 2016-05-20 DIAGNOSIS — O99333 Smoking (tobacco) complicating pregnancy, third trimester: Secondary | ICD-10-CM | POA: Insufficient documentation

## 2016-05-20 DIAGNOSIS — O133 Gestational [pregnancy-induced] hypertension without significant proteinuria, third trimester: Secondary | ICD-10-CM

## 2016-05-20 DIAGNOSIS — O99343 Other mental disorders complicating pregnancy, third trimester: Secondary | ICD-10-CM | POA: Diagnosis not present

## 2016-05-20 DIAGNOSIS — K589 Irritable bowel syndrome without diarrhea: Secondary | ICD-10-CM | POA: Insufficient documentation

## 2016-05-20 DIAGNOSIS — O26893 Other specified pregnancy related conditions, third trimester: Secondary | ICD-10-CM | POA: Diagnosis not present

## 2016-05-20 DIAGNOSIS — O4703 False labor before 37 completed weeks of gestation, third trimester: Secondary | ICD-10-CM | POA: Diagnosis not present

## 2016-05-20 DIAGNOSIS — E876 Hypokalemia: Secondary | ICD-10-CM | POA: Diagnosis present

## 2016-05-20 DIAGNOSIS — Z3A29 29 weeks gestation of pregnancy: Secondary | ICD-10-CM | POA: Insufficient documentation

## 2016-05-20 DIAGNOSIS — E039 Hypothyroidism, unspecified: Secondary | ICD-10-CM | POA: Insufficient documentation

## 2016-05-20 LAB — COMPREHENSIVE METABOLIC PANEL
ALBUMIN: 3.1 g/dL — AB (ref 3.5–5.0)
ALK PHOS: 85 U/L (ref 38–126)
ALT: 19 U/L (ref 14–54)
AST: 24 U/L (ref 15–41)
Anion gap: 9 (ref 5–15)
CHLORIDE: 109 mmol/L (ref 101–111)
CO2: 19 mmol/L — ABNORMAL LOW (ref 22–32)
CREATININE: 0.54 mg/dL (ref 0.44–1.00)
Calcium: 8.5 mg/dL — ABNORMAL LOW (ref 8.9–10.3)
GFR calc non Af Amer: >60 mL/min (ref >60)
GLUCOSE: 88 mg/dL (ref 65–99)
Potassium: 2.4 mmol/L — CL (ref 3.5–5.1)
SODIUM: 137 mmol/L (ref 135–145)
Total Bilirubin: 0.5 mg/dL (ref 0.3–1.2)
Total Protein: 6.9 g/dL (ref 6.5–8.1)

## 2016-05-20 LAB — PROTEIN / CREATININE RATIO, URINE
CREATININE, URINE: 15 mg/dL
Total Protein, Urine: <6 mg/dL

## 2016-05-20 LAB — CBC
HCT: 30 % — ABNORMAL LOW (ref 36.0–46.0)
HEMOGLOBIN: 10.6 g/dL — AB (ref 12.0–15.0)
MCH: 30.4 pg (ref 26.0–34.0)
MCHC: 35.3 g/dL (ref 30.0–36.0)
MCV: 86 fL (ref 78.0–100.0)
PLATELETS: 236 10*3/uL (ref 150–400)
RBC: 3.49 MIL/uL — AB (ref 3.87–5.11)
RDW: 12.5 % (ref 11.5–15.5)
WBC: 18.7 10*3/uL — AB (ref 4.0–10.5)

## 2016-05-20 LAB — URINALYSIS, ROUTINE W REFLEX MICROSCOPIC
BILIRUBIN URINE: NEGATIVE
GLUCOSE, UA: NEGATIVE mg/dL
HGB URINE DIPSTICK: NEGATIVE
Ketones, ur: NEGATIVE mg/dL
Nitrite: NEGATIVE
PH: 5.5 (ref 5.0–8.0)
Protein, ur: NEGATIVE mg/dL

## 2016-05-20 LAB — URINE MICROSCOPIC-ADD ON

## 2016-05-20 MED ORDER — ONDANSETRON 4 MG PO TBDP
4.0000 mg | ORAL_TABLET | Freq: Once | ORAL | Status: AC
  Administered 2016-05-20: 4 mg via ORAL
  Filled 2016-05-20: qty 1

## 2016-05-20 MED ORDER — ONDANSETRON 4 MG PO TBDP: 4.0000 mg | ORAL_TABLET | Freq: Three times a day (TID) | ORAL | Status: DC | PRN

## 2016-05-20 MED ORDER — DOXYLAMINE-PYRIDOXINE 10-10 MG PO TBEC: 2.0000 | DELAYED_RELEASE_TABLET | Freq: Every day | ORAL | Status: DC

## 2016-05-20 MED ORDER — POTASSIUM CHLORIDE CRYS ER 20 MEQ PO TBCR
40.0000 meq | EXTENDED_RELEASE_TABLET | Freq: Once | ORAL | Status: AC
  Administered 2016-05-20: 40 meq via ORAL
  Filled 2016-05-20: qty 2

## 2016-05-20 MED ORDER — POTASSIUM CHLORIDE ER 20 MEQ PO TBCR: 40.0000 meq | EXTENDED_RELEASE_TABLET | Freq: Two times a day (BID) | ORAL | Status: DC

## 2016-05-20 NOTE — Telephone Encounter (Signed)
Pt has been contacted and appt has schedule for 3 hour gtt

## 2016-05-20 NOTE — Telephone Encounter (Signed)
Had Dr Vergie LivingPickens review patient's labs who states patient needs 3 hr gtt & her current rpr status is fine & doesn't require additional treatment. RPR will be redrawn at time of hospital admission. Called patient, no answer- left message stating we are trying to reach you with results & to return your phone call, please call us back at the clinics

## 2016-05-20 NOTE — Discharge Instructions (Signed)
Hypertension During Pregnancy  Hypertension, or high blood pressure, is when there is extra pressure inside your blood vessels that carry blood from the heart to the rest of your body (arteries). It can happen at any time in life, including pregnancy. Hypertension during pregnancy can cause problems for you and your baby. Your baby might not weigh as much as he or she should at birth or might be born early (premature). Very bad cases of hypertension during pregnancy can be life-threatening.   Different types of hypertension can occur during pregnancy. These include:  · Chronic hypertension. This happens when a woman has hypertension before pregnancy and it continues during pregnancy.  · Gestational hypertension. This is when hypertension develops during pregnancy.  · Preeclampsia or toxemia of pregnancy. This is a very serious type of hypertension that develops only during pregnancy. It affects the whole body and can be very dangerous for both mother and baby.    Gestational hypertension and preeclampsia usually go away after your baby is born. Your blood pressure will likely stabilize within 6 weeks. Women who have hypertension during pregnancy have a greater chance of developing hypertension later in life or with future pregnancies.  RISK FACTORS  There are certain factors that make it more likely for you to develop hypertension during pregnancy. These include:  · Having hypertension before pregnancy.  · Having hypertension during a previous pregnancy.  · Being overweight.  · Being older than 40 years.  · Being pregnant with more than one baby.  · Having diabetes or kidney problems.  SIGNS AND SYMPTOMS  Chronic and gestational hypertension rarely cause symptoms. Preeclampsia has symptoms, which may include:  · Increased protein in your urine. Your health care provider will check for this at every prenatal visit.  · Swelling of your hands and face.  · Rapid weight gain.  · Headaches.  · Visual changes.  · Being  bothered by light.  · Abdominal pain, especially in the upper right area.  · Chest pain.  · Shortness of breath.  · Increased reflexes.  · Seizures. These occur with a more severe form of preeclampsia, called eclampsia.  DIAGNOSIS   You may be diagnosed with hypertension during a regular prenatal exam. At each prenatal visit, you may have:  · Your blood pressure checked.  · A urine test to check for protein in your urine.  The type of hypertension you are diagnosed with depends on when you developed it. It also depends on your specific blood pressure reading.  · Developing hypertension before 20 weeks of pregnancy is consistent with chronic hypertension.  · Developing hypertension after 20 weeks of pregnancy is consistent with gestational hypertension.  · Hypertension with increased urinary protein is diagnosed as preeclampsia.  · Blood pressure measurements that stay above 160 systolic or 110 diastolic are a sign of severe preeclampsia.  TREATMENT  Treatment for hypertension during pregnancy varies. Treatment depends on the type of hypertension and how serious it is.  · If you take medicine for chronic hypertension, you may need to switch medicines.    Medicines called ACE inhibitors should not be taken during pregnancy.    Low-dose aspirin may be suggested for women who have risk factors for preeclampsia.  · If you have gestational hypertension, you may need to take a blood pressure medicine that is safe during pregnancy. Your health care provider will recommend the correct medicine.  · If you have severe preeclampsia, you may need to be in the hospital. Health care   done to protect you and your baby. The only cure for preeclampsia is delivery.  Your health  care provider may recommend that you take one low-dose aspirin (81 mg) each day to help prevent high blood pressure during your pregnancy if you are at risk for preeclampsia. You may be at risk for preeclampsia if:  You had preeclampsia or eclampsia during a previous pregnancy.  Your baby did not grow as expected during a previous pregnancy.  You experienced preterm birth with a previous pregnancy.  You experienced a separation of the placenta from the uterus (placental abruption) during a previous pregnancy.  You experienced the loss of your baby during a previous pregnancy.  You are pregnant with more than one baby.  You have other medical conditions, such as diabetes or an autoimmune disease. HOME CARE INSTRUCTIONS  Schedule and keep all of your regular prenatal care appointments. This is important.  Take medicines only as directed by your health care provider. Tell your health care provider about all medicines you take.  Eat as little salt as possible.  Get regular exercise.  Do not drink alcohol.  Do not use tobacco products.  Do not drink products with caffeine.  Lie on your left side when resting. SEEK IMMEDIATE MEDICAL CARE IF:  You have severe abdominal pain.  You have sudden swelling in your hands, ankles, or face.  You gain 4 pounds (1.8 kg) or more in 1 week.  You vomit repeatedly.  You have vaginal bleeding.  You do not feel your baby moving as much.  You have a headache.  You have blurred or double vision.  You have muscle twitching or spasms.  You have shortness of breath.  You have blue fingernails or lips.  You have blood in your urine. MAKE SURE YOU:  Understand these instructions.  Will watch your condition.  Will get help right away if you are not doing well or get worse.   This information is not intended to replace advice given to you by your health care provider. Make sure you discuss any questions you have with your health care  provider.   Document Released: 08/17/2011 Document Revised: 12/20/2014 Document Reviewed: 06/28/2013 Elsevier Interactive Patient Education 2016 [employer] Hypokalemia Hypokalemia means that the amount of potassium in the blood is lower than normal.Potassium is a chemical, called an electrolyte, that helps regulate the amount of fluid in the body. It also stimulates muscle contraction and helps nerves function properly.Most of the body's potassium is inside of cells, and only a very small amount is in the blood. Because the amount in the blood is so small, minor changes can be life-threatening. CAUSES  Antibiotics.  Diarrhea or vomiting.  Using laxatives too much, which can cause diarrhea.  Chronic kidney disease.  Water pills (diuretics).  Eating disorders (bulimia).  Low magnesium level.  Sweating a lot. SIGNS AND SYMPTOMS  Weakness.  Constipation.  Fatigue.  Muscle cramps.  Mental confusion.  Skipped heartbeats or irregular heartbeat (palpitations).  Tingling or numbness. DIAGNOSIS  Your health care provider can diagnose hypokalemia with blood tests. In addition to checking your potassium level, your health care provider may also check other lab tests. TREATMENT Hypokalemia can be treated with potassium supplements taken by mouth or adjustments in your current medicines. If your potassium level is very low, you may need to get potassium through a vein (IV) and be monitored in the hospital. A diet high in potassium is also helpful. Foods high in potassium are:  Nuts, such as peanuts and pistachios.  Seeds, such as sunflower seeds and pumpkin seeds.  Peas, lentils, and lima beans.  Whole grain and bran cereals and breads.  Fresh fruit and vegetables, such as apricots, avocado, bananas, cantaloupe, kiwi, oranges, tomatoes, asparagus, and potatoes.  Orange and tomato juices.  Red meats.  Fruit yogurt. HOME CARE INSTRUCTIONS  Take all medicines as  prescribed by your health care provider.  Maintain a healthy diet by including nutritious food, such as fruits, vegetables, nuts, whole grains, and lean meats.  If you are taking a laxative, be sure to follow the directions on the label. SEEK MEDICAL CARE IF:  Your weakness gets worse.  You feel your heart pounding or racing.  You are vomiting or having diarrhea.  You are diabetic and having trouble keeping your blood glucose in the normal range. SEEK IMMEDIATE MEDICAL CARE IF:  You have chest pain, shortness of breath, or dizziness.  You are vomiting or having diarrhea for more than 2 days.  You faint. MAKE SURE YOU:   Understand these instructions.  Will watch your condition.  Will get help right away if you are not doing well or get worse.   This information is not intended to replace advice given to you by your health care provider. Make sure you discuss any questions you have with your health care provider.   Document Released: 11/29/2005 Document Revised: 12/20/2014 Document Reviewed: 06/01/2013 Elsevier Interactive Patient Education 2016 [employer] Inc[employer] Abdominal Pain During Pregnancy Abdominal pain is common in pregnancy. Most of the time, it does not cause harm. There are many causes of abdominal pain. Some causes are more serious than others. Some of the causes of abdominal pain in pregnancy are easily diagnosed. Occasionally, the diagnosis takes time to understand. Other times, the cause is not determined. Abdominal pain can be a sign that something is very wrong with the pregnancy, or the pain may have nothing to do with the pregnancy at all. For this reason, always tell your health care provider if you have any abdominal discomfort. HOME CARE INSTRUCTIONS  Monitor your abdominal pain for any changes. The following actions may help to alleviate any discomfort you are experiencing:  Do not have sexual intercourse or put anything in your vagina until your symptoms go  away completely.  Get plenty of rest until your pain improves.  Drink clear fluids if you feel nauseous. Avoid solid food as long as you are uncomfortable or nauseous.  Only take over-the-counter or prescription medicine as directed by your health care provider.  Keep all follow-up appointments with your health care provider. SEEK IMMEDIATE MEDICAL CARE IF:  You are bleeding, leaking fluid, or passing tissue from the vagina.  You have increasing pain or cramping.  You have persistent vomiting.  You have painful or bloody urination.  You have a fever.  You notice a decrease in your baby's movements.  You have extreme weakness or feel faint.  You have shortness of breath, with or without abdominal pain.  You develop a severe headache with abdominal pain.  You have abnormal vaginal discharge with abdominal pain.  You have persistent diarrhea.  You have abdominal pain that continues even after rest, or gets worse. MAKE SURE YOU:   Understand these instructions.  Will watch your condition.  Will get help right away if you are not doing well or get worse.   This information is not intended to replace advice given to you by your health care provider. Make  sure you discuss any questions you have with your health care provider.   Document Released: 11/29/2005 Document Revised: 09/19/2013 Document Reviewed: 06/28/2013 Elsevier Interactive Patient Education [employer]

## 2016-05-20 NOTE — MAU Provider Note (Signed)
History     CSN: [id number]  Arrival date and time: 05/20/16 1700   First Provider Initiated Contact with Patient 05/20/16 1742      Chief Complaint  Patient presents with  . Contractions   HPI   Ms. Misty Mills is 20 y.o. female [phone] @ 76w6dwith a history of preeclampsia with previous pregnancy here today with contractions that started today @ 1400.  When I woke up with this morning It looked like the "baby had dropped lowe in my pelvis".   I haven't felt well today; I have vomited 5 x today. I am still taking diclegis and it works well, however at times it does not work. I have nothing else at home to take for N/V. She denies sick contacts, no diarrhea.   + fetal movement, denies vaginal bleeding.  +white vaginal discharge.    Upon review of records the patient had a BP of 146/94 on 4/17 Today initial BP was 140/88  OB History    Gravida Para Term Preterm AB TAB SAB Ectopic Multiple Living   _0 Past Medical History  Diagnosis Date  . Hypothyroidism   . Colitis   . Gastritis   . Preeclampsia   . Irritable bowel   . Preterm labor   . Anxiety     Past Surgical History  Procedure Laterality Date  . Appendectomy    . Cholecystectomy      Family History  Problem Relation Age of Onset  . Hypothyroidism Mother   . Hypertension Mother   . Mental illness Father     Social History  Substance Use Topics  . Smoking status: Current Every Day Smoker -- 0.50 packs/day    Types: Cigarettes  . Smokeless tobacco: Never Used  . Alcohol Use: No    Allergies:  Allergies  Allergen Reactions  . Morphine Other (See Comments)    Makes pain worse    Prescriptions prior to admission  Medication Sig Dispense Refill Last Dose  . DICLEGIS 10-10 MG TBEC TAKE 2 TABLETS BY MOUTH AT BEDTIME 120 tablet 2 05/20/2016 at Unknown time  . escitalopram (LEXAPRO) 10 MG tablet Take 10 mg by mouth daily.    Past Week at Unknown time  . levothyroxine (LEVOTHROID) 25  MCG tablet Take 25 mcg by mouth daily before breakfast.    05/20/2016 at Unknown time  . Prenatal Vit-Fe Fumarate-FA (PRENATAL VITAMINS PLUS) 27-1 MG TABS Take 1 tablet by mouth once. (Patient taking differently: Take 1 tablet by mouth daily. ) 30 tablet 2 Past Week at Unknown time  . ranitidine (ZANTAC) 150 MG tablet Take 1 tablet (150 mg total) by mouth 2 (two) times daily. 60 tablet 3 05/20/2016 at Unknown time  . ondansetron (ZOFRAN) 4 MG tablet Take 1-2 tablets the morning of your appointment. (Patient not taking: Reported on 05/20/2016) 2 tablet 0 Taking   Results for orders placed or performed during the hospital encounter of 05/20/16 (from the past 48 hour(s))  [facility]inal[facility], [facility] w reflex microscopic [facility]     Status: Abnormal   Collection Time: 05/20/16  5:10 PM  Result Value Ref Range   Color, Urine STRAW (A) YELLOW   APPearance CLEAR CLEAR   Specific Gravity, Urine <1.005 (L) 1.005 - 1.030   pH 5.5 5.0 - 8.0   Glucose, UA NEGATIVE NEGATIVE mg/dL   Hgb urine dipstick NEGATIVE NEGATIVE   Bilirubin Urine NEGATIVE NEGATIVE  Ketones, ur NEGATIVE NEGATIVE mg/dL   Protein, ur NEGATIVE NEGATIVE mg/dL   Nitrite NEGATIVE NEGATIVE   Leukocytes, UA TRACE (A) NEGATIVE  Urine microscopic-add on     Status: Abnormal   Collection Time: 05/20/16  5:10 PM  Result Value Ref Range   Squamous Epithelial / LPF 0-5 (A) NONE SEEN   WBC, UA 0-5 0 - 5 WBC/hpf   RBC / HPF 0-5 0 - 5 RBC/hpf   Bacteria, UA RARE (A) NONE SEEN  CBC     Status: Abnormal   Collection Time: 05/20/16  6:18 PM  Result Value Ref Range   WBC 18.7 (H) 4.0 - 10.5 K/uL   RBC 3.49 (L) 3.87 - 5.11 MIL/uL   Hemoglobin 10.6 (L) 12.0 - 15.0 g/dL   HCT 30.0 (L) 36.0 - 46.0 %   MCV 86.0 78.0 - 100.0 fL   MCH 30.4 26.0 - 34.0 pg   MCHC 35.3 30.0 - 36.0 g/dL   RDW 12.5 11.5 - 15.5 %   Platelets 236 150 - 400 K/uL  Comprehensive metabolic panel     Status: Abnormal   Collection Time: 05/20/16  6:18 PM  Result Value Ref Range    Sodium 137 135 - 145 mmol/L   Potassium 2.4 (LL) 3.5 - 5.1 mmol/L    Comment: CRITICAL RESULT CALLED TO, READ BACK BY AND VERIFIED WITH: WILSON,K AT 1855 ON 05/20/16 BY HAGGINSC    Chloride 109 101 - 111 mmol/L   CO2 19 (L) 22 - 32 mmol/L   Glucose, Bld 88 65 - 99 mg/dL   BUN <5 (L) 6 - 20 mg/dL    Comment: REPEATED TO VERIFY   Creatinine, Ser 0.54 0.44 - 1.00 mg/dL   Calcium 8.5 (L) 8.9 - 10.3 mg/dL   Total Protein 6.9 6.5 - 8.1 g/dL   Albumin 3.1 (L) 3.5 - 5.0 g/dL   AST 24 15 - 41 U/L   ALT 19 14 - 54 U/L   Alkaline Phosphatase 85 38 - 126 U/L   Total Bilirubin 0.5 0.3 - 1.2 mg/dL   GFR calc non Af Amer >60 >60 mL/min   GFR calc Af Amer >60 >60 mL/min    Comment: (NOTE) The eGFR has been calculated using the CKD EPI equation. This calculation has not been validated in all clinical situations. eGFR's persistently <60 mL/min signify possible Chronic Kidney Disease.    Anion gap 9 5 - 15    Review of Systems  Constitutional: Negative for fever and malaise/fatigue.  Gastrointestinal: Positive for nausea and vomiting (4-5 episodes of vomiting today only. ).  Genitourinary: Positive for dysuria (Occasional ) and urgency. Negative for hematuria.  Musculoskeletal: Positive for back pain.  Neurological: Negative for dizziness and weakness.   Physical Exam   Blood pressure 121/77, pulse 90, temperature 98.5 F (36.9 C), temperature source Oral, resp. rate 18, height _0  (1.6 m), weight 147 lb (66.679 kg), last menstrual period 10/01/2015.  Physical Exam  Constitutional: She is oriented to person, place, and time. She appears well-developed and well-nourished. No distress.  Cardiovascular: Normal rate and normal heart sounds.   GI: Soft. Normal appearance. There is no tenderness. There is no CVA tenderness.  Genitourinary:  Dilation: Closed Effacement (%): Thick Cervical Position: Posterior Station:  (high) Exam by:: Lennis Korb,np  Musculoskeletal: Normal range of  motion.  Neurological: She is alert and oriented to person, place, and time.  Skin: Skin is warm. She is not diaphoretic.  Psychiatric: Her behavior is  normal.   Fetal Tracing: Baseline: 135 bpm  Variability: Moderate  Accelerations: 15x15 Decelerations: variable decel down to 120 bpm for 40 sec. Back to baseline with good recovery. Acceleration following.  Toco: quiet    Results for orders placed or performed during the hospital encounter of 05/20/16 (from the past 24 hour(s))  Urinalysis, Routine w reflex microscopic (not at [facility])     Status: Abnormal   Collection Time: 05/20/16  5:10 PM  Result Value Ref Range   Color, Urine STRAW (A) YELLOW   APPearance CLEAR CLEAR   Specific Gravity, Urine <1.005 (L) 1.005 - 1.030   pH 5.5 5.0 - 8.0   Glucose, UA NEGATIVE NEGATIVE mg/dL   Hgb urine dipstick NEGATIVE NEGATIVE   Bilirubin Urine NEGATIVE NEGATIVE   Ketones, ur NEGATIVE NEGATIVE mg/dL   Protein, ur NEGATIVE NEGATIVE mg/dL   Nitrite NEGATIVE NEGATIVE   Leukocytes, UA TRACE (A) NEGATIVE  Urine microscopic-add on     Status: Abnormal   Collection Time: 05/20/16  5:10 PM  Result Value Ref Range   Squamous Epithelial / LPF 0-5 (A) NONE SEEN   WBC, UA 0-5 0 - 5 WBC/hpf   RBC / HPF 0-5 0 - 5 RBC/hpf   Bacteria, UA RARE (A) NONE SEEN  Protein / creatinine ratio, urine     Status: None   Collection Time: 05/20/16  5:10 PM  Result Value Ref Range   Creatinine, Urine 15.00 mg/dL   Total Protein, Urine <6 mg/dL   Protein Creatinine Ratio        0.00 - 0.15 mg/mg[Cre]  CBC     Status: Abnormal   Collection Time: 05/20/16  6:18 PM  Result Value Ref Range   WBC 18.7 (H) 4.0 - 10.5 K/uL   RBC 3.49 (L) 3.87 - 5.11 MIL/uL   Hemoglobin 10.6 (L) 12.0 - 15.0 g/dL   HCT 30.0 (L) 36.0 - 46.0 %   MCV 86.0 78.0 - 100.0 fL   MCH 30.4 26.0 - 34.0 pg   MCHC 35.3 30.0 - 36.0 g/dL   RDW 12.5 11.5 - 15.5 %   Platelets 236 150 - 400 K/uL  Comprehensive metabolic panel     Status: Abnormal    Collection Time: 05/20/16  6:18 PM  Result Value Ref Range   Sodium 137 135 - 145 mmol/L   Potassium 2.4 (LL) 3.5 - 5.1 mmol/L   Chloride 109 101 - 111 mmol/L   CO2 19 (L) 22 - 32 mmol/L   Glucose, Bld 88 65 - 99 mg/dL   BUN <5 (L) 6 - 20 mg/dL   Creatinine, Ser 0.54 0.44 - 1.00 mg/dL   Calcium 8.5 (L) 8.9 - 10.3 mg/dL   Total Protein 6.9 6.5 - 8.1 g/dL   Albumin 3.1 (L) 3.5 - 5.0 g/dL   AST 24 15 - 41 U/L   ALT 19 14 - 54 U/L   Alkaline Phosphatase 85 38 - 126 U/L   Total Bilirubin 0.5 0.3 - 1.2 mg/dL   GFR calc non Af Amer >60 >60 mL/min   GFR calc Af Amer >60 >60 mL/min   Anion gap 9 5 - 15    MAU Course  Procedures None   MDM  CBC CMP  Zofran 4 mg given.  Kdur 40 meq X 1 dose.  Patient tolerating PO fluids at this time.  Consulted with pharmacy regarding hypokalemia and recommendations for PO potassium dosing Patient tolerate PO K.   Discussed patient with Dr. Harolyn Rutherford, discussed  labs and vital signs Protein creatine ratio added.  Patient unable to stay for protein creatine ratio; discussed calling patient with the results. Patient has appointment on Monday in the [facility]   Assessment and Plan   A:   1. Gestational hypertension, third trimester   2. Hypokalemia   3. Braxton Hick's contraction        P:  Discharge home in stable condition Will call patient with protein levels Patient instructed to keep appointment on Monday  Preeclampsia precautions Discussed starting ASA 81 mg daily; patient to discuss at Clinic on Monday. Recommend starting today 81 mg PO  Rx: Kdur, Zofran Return to MAU if symptoms worsen Fetal kick counts   Lezlie Lye, NP 05/20/2016 8:02 PM

## 2016-05-20 NOTE — MAU Note (Signed)
CRITICAL VALUE ALERT  Critical value received:  K+   Date of notification:  05/20/16  Time of notification:  1855  Critical value read back:Yes.    Nurse who received alert:  K.Emir Nack,RN  MD notified (1st page):  1858  Time of first page:  1858  MD notified (2nd page):N/A  Time of second page:  Responding MD:  N/A  Time MD responded:  N/A

## 2016-05-20 NOTE — MAU Note (Signed)
Pt reports she has been having braxton hicks cts for about 2 week. Ctx are now about 5 min apart and more uncomforatable. Stated she feels lightheaded and dizzy. Feels like baby hs dropped and has decreased fetal movement

## 2016-05-24 ENCOUNTER — Other Ambulatory Visit: Payer: BLUE CROSS/BLUE SHIELD

## 2016-05-24 DIAGNOSIS — R7309 Other abnormal glucose: Secondary | ICD-10-CM

## 2016-05-25 ENCOUNTER — Other Ambulatory Visit (HOSPITAL_COMMUNITY): Payer: Self-pay | Admitting: Maternal and Fetal Medicine

## 2016-05-25 ENCOUNTER — Ambulatory Visit (HOSPITAL_COMMUNITY)
Admission: RE | Admit: 2016-05-25 | Discharge: 2016-05-25 | Disposition: A | Discharge status: Home / Self Care | Payer: Medicaid Other | Source: Ambulatory Visit | Attending: Student | Admitting: Student

## 2016-05-25 ENCOUNTER — Encounter (HOSPITAL_COMMUNITY): Payer: Self-pay

## 2016-05-25 VITALS — BP 130/80 | HR 87 | Wt 148.4 lb

## 2016-05-25 DIAGNOSIS — O09213 Supervision of pregnancy with history of pre-term labor, third trimester: Secondary | ICD-10-CM | POA: Diagnosis not present

## 2016-05-25 DIAGNOSIS — O09299 Supervision of pregnancy with other poor reproductive or obstetric history, unspecified trimester: Secondary | ICD-10-CM

## 2016-05-25 DIAGNOSIS — O09293 Supervision of pregnancy with other poor reproductive or obstetric history, third trimester: Secondary | ICD-10-CM

## 2016-05-25 DIAGNOSIS — O09893 Supervision of other high risk pregnancies, third trimester: Secondary | ICD-10-CM

## 2016-05-25 DIAGNOSIS — O99283 Endocrine, nutritional and metabolic diseases complicating pregnancy, third trimester: Secondary | ICD-10-CM | POA: Insufficient documentation

## 2016-05-25 DIAGNOSIS — E039 Hypothyroidism, unspecified: Secondary | ICD-10-CM | POA: Diagnosis not present

## 2016-05-25 DIAGNOSIS — Z3A30 30 weeks gestation of pregnancy: Secondary | ICD-10-CM | POA: Diagnosis not present

## 2016-05-25 DIAGNOSIS — O99333 Smoking (tobacco) complicating pregnancy, third trimester: Secondary | ICD-10-CM | POA: Insufficient documentation

## 2016-05-25 DIAGNOSIS — O133 Gestational [pregnancy-induced] hypertension without significant proteinuria, third trimester: Secondary | ICD-10-CM

## 2016-05-25 DIAGNOSIS — Z8751 Personal history of pre-term labor: Secondary | ICD-10-CM

## 2016-05-25 DIAGNOSIS — O99343 Other mental disorders complicating pregnancy, third trimester: Secondary | ICD-10-CM | POA: Diagnosis not present

## 2016-05-25 DIAGNOSIS — E876 Hypokalemia: Secondary | ICD-10-CM

## 2016-06-01 ENCOUNTER — Encounter: Payer: BLUE CROSS/BLUE SHIELD | Admitting: Family Medicine

## 2016-06-07 ENCOUNTER — Telehealth: Payer: Self-pay

## 2016-06-07 DIAGNOSIS — O09293 Supervision of pregnancy with other poor reproductive or obstetric history, third trimester: Secondary | ICD-10-CM

## 2016-06-07 NOTE — Telephone Encounter (Signed)
Patient left a message for stating she was admitted over night stay at the hospital for low potassium and fatigue. They prescribed her potassium to take but she is unsure if she should continued with the provider plan. Patient is requesting rx for her prenatal vitamins.

## 2016-06-09 MED ORDER — PRENATAL VITAMINS PLUS 27-1 MG PO TABS: 1.0000 | ORAL_TABLET | Freq: Every day | ORAL | Status: AC

## 2016-06-09 NOTE — Addendum Note (Signed)
Addended by: Gerome ApleyZEYFANG, LINDA L on: 06/09/2016 01:19 PM   Modules accepted: Orders

## 2016-06-09 NOTE — Telephone Encounter (Signed)
Misty Mills called and left another message stating her pharmacist is having problems with contacting us. States she has ran out of her prenatal vitamins and needs a refill.  I called Misty Mills and left a  Message I am returning her call and will send in a refill as requested- may call us back if desired.  Next ob appt is 06/10/16.

## 2016-06-10 ENCOUNTER — Ambulatory Visit (INDEPENDENT_AMBULATORY_CARE_PROVIDER_SITE_OTHER): Payer: BLUE CROSS/BLUE SHIELD | Admitting: Obstetrics & Gynecology

## 2016-06-10 VITALS — BP 126/73 | HR 73 | Temp 98.0°F | Wt 148.7 lb

## 2016-06-10 DIAGNOSIS — Z3490 Encounter for supervision of normal pregnancy, unspecified, unspecified trimester: Secondary | ICD-10-CM | POA: Insufficient documentation

## 2016-06-10 DIAGNOSIS — Z3493 Encounter for supervision of normal pregnancy, unspecified, third trimester: Secondary | ICD-10-CM | POA: Diagnosis not present

## 2016-06-10 LAB — POCT URINALYSIS DIP (DEVICE)
BILIRUBIN URINE: NEGATIVE
GLUCOSE, UA: NEGATIVE mg/dL
Hgb urine dipstick: NEGATIVE
KETONES UR: NEGATIVE mg/dL
Nitrite: NEGATIVE
PH: 7 (ref 5.0–8.0)
PROTEIN: NEGATIVE mg/dL
SPECIFIC GRAVITY, URINE: 1.015 (ref 1.005–1.030)
Urobilinogen, UA: 0.2 mg/dL (ref 0.0–1.0)

## 2016-06-10 NOTE — Patient Instructions (Signed)
Return to clinic for any scheduled appointments or obstetric concerns, or go to MAU for evaluation  

## 2016-06-10 NOTE — Progress Notes (Signed)
Subjective:  Misty Mills is a 20 y.o. G2P0101 at 7014w6d being seen today for ongoing prenatal care.  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  Patient reports no complaints.  Contractions: Not present. Vag. Bleeding: None.  Movement: Present. Denies leaking of fluid.   The following portions of the patient's history were reviewed and updated as appropriate: allergies, current medications, past family history, past medical history, past social history, past surgical history and problem list. Problem list updated.  Objective:   Filed Vitals:   06/10/16 0854  BP: 126/73  Pulse: 73  Temp: 98 F (36.7 C)  Weight: 148 lb 11.2 oz (67.45 kg)    Fetal Status: Fetal Heart Rate (bpm): 135   Movement: Present     General:  Alert, oriented and cooperative. Patient is in no acute distress.  Skin: Skin is warm and dry. No rash noted.   Cardiovascular: Normal heart rate noted  Respiratory: Normal respiratory effort, no problems with respiration noted  Abdomen: Soft, gravid, appropriate for gestational age. Pain/Pressure: Present     Pelvic: Cervical exam deferred        Extremities: Normal range of motion.  Edema: Trace  Mental Status: Normal mood and affect. Normal behavior. Normal judgment and thought content.   Urinalysis: Urine Protein: Negative Urine Glucose: Negative  Assessment and Plan:  Pregnancy: G2P0101 at 6914w6d  1. Supervision of low-risk pregnancy, third trimester Emphasized importance of getting 3 hr GTT done ASAP (had 1 hr GTT of 146). Preterm labor symptoms and general obstetric precautions including but not limited to vaginal  bleeding, contractions, leaking of fluid and fetal movement were reviewed in detail with the patient. Please refer to After Visit Summary for other counseling recommendations.  Return in about 2 weeks (around 06/24/2016) for OB Visit.   Tereso NewcomerUgonna A Saesha Llerenas, MD

## 2016-06-10 NOTE — Telephone Encounter (Signed)
Patient has appt today.

## 2016-06-10 NOTE — Progress Notes (Signed)
Pt c/o nausea/vomiting today, cannot do 3hr gtt today

## 2016-06-14 ENCOUNTER — Encounter (HOSPITAL_COMMUNITY): Payer: Self-pay | Admitting: *Deleted

## 2016-06-14 ENCOUNTER — Inpatient Hospital Stay (HOSPITAL_COMMUNITY)
Admission: AD | Admit: 2016-06-14 | Discharge: 2016-06-14 | Disposition: A | Discharge status: Home / Self Care | Payer: BLUE CROSS/BLUE SHIELD | Source: Ambulatory Visit | Attending: Obstetrics and Gynecology | Admitting: Obstetrics and Gynecology

## 2016-06-14 DIAGNOSIS — Z3493 Encounter for supervision of normal pregnancy, unspecified, third trimester: Secondary | ICD-10-CM | POA: Diagnosis not present

## 2016-06-14 LAB — URINALYSIS, ROUTINE W REFLEX MICROSCOPIC
Bilirubin Urine: NEGATIVE
Glucose, UA: NEGATIVE mg/dL
Hgb urine dipstick: NEGATIVE
Ketones, ur: NEGATIVE mg/dL
NITRITE: NEGATIVE
PH: 6.5 (ref 5.0–8.0)
Protein, ur: NEGATIVE mg/dL
SPECIFIC GRAVITY, URINE: 1.01 (ref 1.005–1.030)

## 2016-06-14 LAB — URINE MICROSCOPIC-ADD ON: RBC / HPF: NONE SEEN RBC/hpf (ref 0–5)

## 2016-06-14 NOTE — MAU Note (Signed)
Pt presents to MAU with complaints of feeling damp in her underwear since noon today. Denies any vaginal bleeding. Reports lower abdominal cramping

## 2016-06-14 NOTE — Discharge Instructions (Signed)
Keep your scheduled appt for prenatal care.  °

## 2016-06-18 ENCOUNTER — Other Ambulatory Visit: Payer: Self-pay | Admitting: General Practice

## 2016-06-18 ENCOUNTER — Telehealth: Payer: Self-pay | Admitting: General Practice

## 2016-06-18 DIAGNOSIS — R51 Headache: Principal | ICD-10-CM

## 2016-06-18 DIAGNOSIS — R519 Headache, unspecified: Secondary | ICD-10-CM

## 2016-06-18 MED ORDER — BUTALBITAL-APAP-CAFFEINE 50-325-40 MG PO TABS: 1.0000 | ORAL_TABLET | Freq: Four times a day (QID) | ORAL | Status: DC | PRN

## 2016-06-18 NOTE — Telephone Encounter (Signed)
Patient called and left message on nurse line stating she was recently [facility] and is waiting on a prescription for her headaches. Patient states someone was supposed to send something in but her pharmacy hasn't received it. Per chart review, patient went to MAU on 7/3 but no notes are found.

## 2016-06-21 MED ORDER — BUTALBITAL-APAP-CAFFEINE 50-325-40 MG PO TABS: 1.0000 | ORAL_TABLET | Freq: Four times a day (QID) | ORAL | Status: DC | PRN

## 2016-06-22 ENCOUNTER — Encounter (HOSPITAL_COMMUNITY): Payer: Self-pay

## 2016-06-22 ENCOUNTER — Ambulatory Visit (HOSPITAL_COMMUNITY)
Admission: RE | Admit: 2016-06-22 | Discharge: 2016-06-22 | Disposition: A | Discharge status: Home / Self Care | Payer: BLUE CROSS/BLUE SHIELD | Source: Ambulatory Visit | Attending: Obstetrics and Gynecology | Admitting: Obstetrics and Gynecology

## 2016-06-22 ENCOUNTER — Other Ambulatory Visit (HOSPITAL_COMMUNITY): Payer: Self-pay | Admitting: Obstetrics and Gynecology

## 2016-06-22 VITALS — BP 119/81 | HR 93 | Wt 149.1 lb

## 2016-06-22 DIAGNOSIS — O09213 Supervision of pregnancy with history of pre-term labor, third trimester: Secondary | ICD-10-CM

## 2016-06-22 DIAGNOSIS — O99343 Other mental disorders complicating pregnancy, third trimester: Secondary | ICD-10-CM

## 2016-06-22 DIAGNOSIS — E039 Hypothyroidism, unspecified: Secondary | ICD-10-CM

## 2016-06-22 DIAGNOSIS — O99280 Endocrine, nutritional and metabolic diseases complicating pregnancy, unspecified trimester: Secondary | ICD-10-CM | POA: Diagnosis not present

## 2016-06-22 DIAGNOSIS — O09293 Supervision of pregnancy with other poor reproductive or obstetric history, third trimester: Secondary | ICD-10-CM

## 2016-06-22 DIAGNOSIS — Z3A34 34 weeks gestation of pregnancy: Secondary | ICD-10-CM

## 2016-06-22 DIAGNOSIS — O99283 Endocrine, nutritional and metabolic diseases complicating pregnancy, third trimester: Secondary | ICD-10-CM

## 2016-06-22 DIAGNOSIS — O09893 Supervision of other high risk pregnancies, third trimester: Secondary | ICD-10-CM

## 2016-06-22 DIAGNOSIS — Z8751 Personal history of pre-term labor: Secondary | ICD-10-CM

## 2016-06-22 DIAGNOSIS — Z3490 Encounter for supervision of normal pregnancy, unspecified, unspecified trimester: Secondary | ICD-10-CM

## 2016-06-22 DIAGNOSIS — O99333 Smoking (tobacco) complicating pregnancy, third trimester: Secondary | ICD-10-CM | POA: Insufficient documentation

## 2016-06-22 DIAGNOSIS — E876 Hypokalemia: Secondary | ICD-10-CM

## 2016-06-24 ENCOUNTER — Telehealth: Payer: Self-pay

## 2016-06-24 NOTE — Telephone Encounter (Signed)
Patient left a message stating she has having contraction they are hour apart. She is concerns and would like to be advised. Please call her at [phone]

## 2016-06-25 ENCOUNTER — Inpatient Hospital Stay (HOSPITAL_COMMUNITY)
Admission: AD | Admit: 2016-06-25 | Discharge: 2016-06-25 | Disposition: A | Discharge status: Home / Self Care | Payer: Medicaid Other | Source: Ambulatory Visit | Attending: Obstetrics & Gynecology | Admitting: Obstetrics & Gynecology

## 2016-06-25 ENCOUNTER — Encounter (HOSPITAL_COMMUNITY): Payer: Self-pay | Admitting: *Deleted

## 2016-06-25 DIAGNOSIS — O4703 False labor before 37 completed weeks of gestation, third trimester: Secondary | ICD-10-CM

## 2016-06-25 DIAGNOSIS — O09293 Supervision of pregnancy with other poor reproductive or obstetric history, third trimester: Secondary | ICD-10-CM

## 2016-06-25 DIAGNOSIS — O133 Gestational [pregnancy-induced] hypertension without significant proteinuria, third trimester: Secondary | ICD-10-CM | POA: Diagnosis present

## 2016-06-25 DIAGNOSIS — R1011 Right upper quadrant pain: Secondary | ICD-10-CM

## 2016-06-25 HISTORY — DX: Hypokalemia: E87.6

## 2016-06-25 LAB — URINE MICROSCOPIC-ADD ON
Bacteria, UA: NONE SEEN
RBC / HPF: NONE SEEN RBC/hpf (ref 0–5)

## 2016-06-25 LAB — URINALYSIS, ROUTINE W REFLEX MICROSCOPIC
Bilirubin Urine: NEGATIVE
Glucose, UA: NEGATIVE mg/dL
Hgb urine dipstick: NEGATIVE
Ketones, ur: NEGATIVE mg/dL
Nitrite: NEGATIVE
Protein, ur: NEGATIVE mg/dL
Specific Gravity, Urine: 1.01 (ref 1.005–1.030)
pH: 6.5 (ref 5.0–8.0)

## 2016-06-25 LAB — CBC
HCT: 29.9 % — ABNORMAL LOW (ref 36.0–46.0)
Hemoglobin: 10.3 g/dL — ABNORMAL LOW (ref 12.0–15.0)
MCH: 29.9 pg (ref 26.0–34.0)
MCHC: 34.4 g/dL (ref 30.0–36.0)
MCV: 86.7 fL (ref 78.0–100.0)
PLATELETS: 254 10*3/uL (ref 150–400)
RBC: 3.45 MIL/uL — AB (ref 3.87–5.11)
RDW: 13.1 % (ref 11.5–15.5)
WBC: 20 10*3/uL — ABNORMAL HIGH (ref 4.0–10.5)

## 2016-06-25 LAB — PROTEIN / CREATININE RATIO, URINE
Creatinine, Urine: 72 mg/dL
PROTEIN CREATININE RATIO: 0.21 mg/mg{creat} — AB (ref 0.00–0.15)
TOTAL PROTEIN, URINE: 15 mg/dL

## 2016-06-25 LAB — COMPREHENSIVE METABOLIC PANEL
ALT: 19 U/L (ref 14–54)
ANION GAP: 7 (ref 5–15)
AST: 23 U/L (ref 15–41)
Albumin: 3 g/dL — ABNORMAL LOW (ref 3.5–5.0)
Alkaline Phosphatase: 123 U/L (ref 38–126)
BUN: <5 mg/dL — ABNORMAL LOW (ref 6–20)
CHLORIDE: 110 mmol/L (ref 101–111)
CO2: 20 mmol/L — AB (ref 22–32)
Calcium: 9.2 mg/dL (ref 8.9–10.3)
Creatinine, Ser: 0.56 mg/dL (ref 0.44–1.00)
Glucose, Bld: 93 mg/dL (ref 65–99)
POTASSIUM: 3 mmol/L — AB (ref 3.5–5.1)
SODIUM: 137 mmol/L (ref 135–145)
Total Bilirubin: 0.3 mg/dL (ref 0.3–1.2)
Total Protein: 6.3 g/dL — ABNORMAL LOW (ref 6.5–8.1)

## 2016-06-25 NOTE — MAU Provider Note (Signed)
Chief Complaint:  Contractions   First Provider Initiated Contact with Patient 06/25/16 1753     HPI: Misty Mills is a 20 y.o. G2P0101 at [redacted]w[redacted]d who presents to maternity admissions reporting contractions, external burning when she urinates, frequency of urination and only urinating a small amount at a time. Also reports HA's throughout pregnancy, but has now developed RUQ pain today and is concerned about preeclampsia, because she has a Hx of it in a previous pregnancy. Has had HA's earlier in pregnancy that resolve w/ Fioricet.   Location: low abd Quality: cramping Severity: Moderate Duration: 2 days Context: none Timing: intermittent Modifying factors: Improved spontaneously since arriving in MAU Associated signs and symptoms: Denies leakage of fluid or vaginal bleeding. Good fetal movement.   Location: Headache Quality: cramping Severity: 5/10 in pain scale Duration: several weeks Context: None Timing: Constant Modifying factors: Resolved w/ Fioricet.  Associated signs and symptoms: Pos for upper abd pain today. Negative for vision changes, fever, chills, congestion, sinus pressure, difficulties with speech or gait.   Past Medical History: Past Medical History  Diagnosis Date  . Hypothyroidism   . Colitis   . Gastritis   . Preeclampsia   . Irritable bowel   . Preterm labor   . Anxiety   . Hypokalemia     Past obstetric history: OB History  Gravida Para Term Preterm AB SAB TAB Ectopic Multiple Living  2 1  1      1     # Outcome Date GA Lbr Len/2nd Weight Sex Delivery Anes PTL Lv  2 Current           1 Preterm 11/05/14 [redacted]w[redacted]d  4 lb 11 oz (2.126 kg) F Vag-Spont EPI Y Y     Comments: preterm delivery; Precclampsia- born at [facility]      Past Surgical History: Past Surgical History  Procedure Laterality Date  . Appendectomy    . Cholecystectomy       Family History: Family History  Problem Relation Age of Onset  . Hypothyroidism Mother   .  Hypertension Mother   . Mental illness Father     Social History: Social History  Substance Use Topics  . Smoking status: Current Every Day Smoker -- 0.50 packs/day    Types: Cigarettes  . Smokeless tobacco: Never Used  . Alcohol Use: No    Allergies:  Allergies  Allergen Reactions  . Latex Other (See Comments)    Causes irritation.  . Morphine Other (See Comments)    Makes pain worse    Meds:  Prescriptions prior to admission  Medication Sig Dispense Refill Last Dose  . butalbital-acetaminophen-caffeine (FIORICET) 50-325-40 MG tablet Take 1-2 tablets by mouth every 6 (six) hours as needed for headache. 30 tablet 0 06/25/2016 at Unknown time  . Doxylamine-Pyridoxine (DICLEGIS) 10-10 MG TBEC Take 2 tablets by mouth daily. Take 2 tablets at bedtime. If symptoms persist, add 1 tab in the AM starting on day 3. If symptoms persist, add 1 tab in the PM starting day 4. (Patient taking differently: Take 2 tablets by mouth 2 (two) times daily. ) 90 tablet 1 06/25/2016 at Unknown time  . escitalopram (LEXAPRO) 10 MG tablet Take 10 mg by mouth daily.    06/24/2016 at Unknown time  . levothyroxine (LEVOTHROID) 25 MCG tablet Take 25 mcg by mouth daily before breakfast.    06/25/2016 at Unknown time  . Prenatal Vit-Fe Fumarate-FA (PRENATAL VITAMINS PLUS) 27-1 MG TABS Take 1 tablet by mouth daily. 30 tablet  6 06/25/2016 at Unknown time  . ranitidine (ZANTAC) 150 MG tablet Take 1 tablet (150 mg total) by mouth 2 (two) times daily. 60 tablet 3 06/25/2016 at Unknown time  . ondansetron (ZOFRAN ODT) 4 MG disintegrating tablet Take 1 tablet (4 mg total) by mouth every 8 (eight) hours as needed for nausea or vomiting. (Patient not taking: Reported on 06/10/2016) 20 tablet 0 Not Taking at Unknown time  . potassium chloride 20 MEQ TBCR Take 40 mEq by mouth 2 (two) times daily. (Patient not taking: Reported on 06/10/2016) 20 tablet 0 Not Taking at Unknown time    I have reviewed patient's Past Medical Hx,  Surgical Hx, Family Hx, Social Hx, medications and allergies.   ROS:  Review of Systems  Constitutional: Negative for fever and chills.  Eyes: Negative for visual disturbance.  Gastrointestinal: Positive for abdominal pain. Negative for nausea, vomiting, diarrhea and constipation.  Genitourinary: Positive for urgency. Negative for dysuria, hematuria, flank pain, vaginal bleeding and vaginal discharge.  Musculoskeletal: Negative for back pain.  Neurological: Positive for headaches.    Physical Exam   Patient Vitals for the past 24 hrs:  BP Temp Temp src Pulse Resp SpO2  06/25/16 1816 111/67 mmHg - - 77 - 99 %  06/25/16 1801 118/69 mmHg - - 73 - 98 %  06/25/16 1746 111/67 mmHg - - 77 - -  06/25/16 1743 119/65 mmHg - - 77 - -  06/25/16 1717 140/88 mmHg 97.6 F (36.4 C) Oral 89 22 100 %   Constitutional: Well-developed, well-nourished female in no acute distress.  Cardiovascular: normal rate Respiratory: normal effort GI: Abd soft, non-tender, gravid appropriate for gestational age. Pos BS x 4 MS: Extremities nontender, no edema, normal ROM Neurologic: Alert and oriented x 4.  GU: Neg CVAT.  Pelvic: NEFG, physiologic discharge, no blood, cervix clean. No CMT  Dilation: 1 Effacement (%):  (long) Exam by:: Ivonne Andrew, CNM  FHT:  Baseline 145 , moderate variability, 15x15 accelerations present, no decelerations Contractions: Irreg, mild   Labs: Results for orders placed or performed during the hospital encounter of 06/25/16 (from the past 24 hour(s))  Urinalysis, Routine w reflex microscopic (not at [facility])     Status: Abnormal   Collection Time: 06/25/16  5:08 PM  Result Value Ref Range   Color, Urine YELLOW YELLOW   APPearance CLOUDY (A) CLEAR   Specific Gravity, Urine 1.010 1.005 - 1.030   pH 6.5 5.0 - 8.0   Glucose, UA NEGATIVE NEGATIVE mg/dL   Hgb urine dipstick NEGATIVE NEGATIVE   Bilirubin Urine NEGATIVE NEGATIVE   Ketones, ur NEGATIVE NEGATIVE mg/dL   Protein, ur  NEGATIVE NEGATIVE mg/dL   Nitrite NEGATIVE NEGATIVE   Leukocytes, UA MODERATE (A) NEGATIVE  Urine microscopic-add on     Status: Abnormal   Collection Time: 06/25/16  5:08 PM  Result Value Ref Range   Squamous Epithelial / LPF 6-30 (A) NONE SEEN   WBC, UA 6-30 0 - 5 WBC/hpf   RBC / HPF NONE SEEN 0 - 5 RBC/hpf   Bacteria, UA NONE SEEN NONE SEEN   Urine-Other AMORPHOUS URATES/PHOSPHATES   Protein / creatinine ratio, urine     Status: Abnormal   Collection Time: 06/25/16  5:08 PM  Result Value Ref Range   Creatinine, Urine 72.00 mg/dL   Total Protein, Urine 15 mg/dL   Protein Creatinine Ratio 0.21 (H) 0.00 - 0.15 mg/mg[Cre]  CBC     Status: Abnormal   Collection Time: 06/25/16  6:15  PM  Result Value Ref Range   WBC 20.0 (H) 4.0 - 10.5 K/uL   RBC 3.45 (L) 3.87 - 5.11 MIL/uL   Hemoglobin 10.3 (L) 12.0 - 15.0 g/dL   HCT 38.729.9 (L) 56.436.0 - 33.246.0 %   MCV 86.7 78.0 - 100.0 fL   MCH 29.9 26.0 - 34.0 pg   MCHC 34.4 30.0 - 36.0 g/dL   RDW 95.113.1 88.411.5 - 16.615.5 %   Platelets 254 150 - 400 K/uL  Comprehensive metabolic panel     Status: Abnormal   Collection Time: 06/25/16  6:15 PM  Result Value Ref Range   Sodium 137 135 - 145 mmol/L   Potassium 3.0 (L) 3.5 - 5.1 mmol/L   Chloride 110 101 - 111 mmol/L   CO2 20 (L) 22 - 32 mmol/L   Glucose, Bld 93 65 - 99 mg/dL   BUN <5 (L) 6 - 20 mg/dL   Creatinine, Ser 0.630.56 0.44 - 1.00 mg/dL   Calcium 9.2 8.9 - 01.610.3 mg/dL   Total Protein 6.3 (L) 6.5 - 8.1 g/dL   Albumin 3.0 (L) 3.5 - 5.0 g/dL   AST 23 15 - 41 U/L   ALT 19 14 - 54 U/L   Alkaline Phosphatase 123 38 - 126 U/L   Total Bilirubin 0.3 0.3 - 1.2 mg/dL   GFR calc non Af Amer >60 >60 mL/min   GFR calc Af Amer >60 >60 mL/min   Anion gap 7 5 - 15    Imaging:  NA  MAU Course: Orders Placed This Encounter  Procedures  . Urinalysis, Routine w reflex microscopic ([facility])  . Urine microscopic-add on  . CBC  . Comprehensive metabolic panel  . Protein / creatinine ratio, urine   MDM: -  Mild HA and upper abd discomfort in pregnancy w/out evidence of Pre-e.  Preterm contractions w/out evidence to PTL. UA contaminated , but no C/W infection. Urgency likely from UC's. Will culture urine.    Assessment: 1. Transient hypertension of pregnancy in third trimester   2. RUQ pain   3. Hx of preeclampsia, prior pregnancy, currently pregnant, third trimester   4. Preterm contractions, third trimester     Plan: Discharge home in stable condition.  Preterm labor precautions and fetal kick counts     Follow-up Information    [facility].   Specialty:  Obstetrics and Gynecology   Why:  at 8:20 am   Contact information:   [address] [place] [phone]      Follow [facility] with [facility].   Why:  As needed if symptoms worsen   Contact information:   [address] [id number] [place]c [place] [id number] [phone]        Medication List    STOP taking these medications        ondansetron 4 MG disintegrating tablet  Commonly known as:  ZOFRAN ODT     Potassium Chloride ER 20 MEQ Tbcr      TAKE these medications        butalbital-acetaminophen-caffeine 50-325-40 MG tablet  Commonly known as:  FIORICET  Take 1-2 tablets by mouth every 6 (six) hours as needed for headache.     Doxylamine-Pyridoxine 10-10 MG Tbec  Commonly known as:  DICLEGIS  Take 2 tablets by mouth daily. Take 2 tablets at bedtime. If symptoms persist, add 1 tab in the AM starting on day  3. If symptoms persist, add 1 tab in the PM starting day 4.     escitalopram 10 MG tablet  Commonly known as:  LEXAPRO  Take 10 mg by mouth daily.     LEVOTHROID 25 MCG tablet  Generic drug:  levothyroxine  Take 25 mcg by mouth daily before breakfast.     PRENATAL VITAMINS PLUS 27-1 MG Tabs  Take 1 tablet by mouth daily.     ranitidine 150 MG tablet  Commonly known  as:  ZANTAC  Take 1 tablet (150 mg total) by mouth 2 (two) times daily.        [place], [place] 06/25/2016 7:46 PM

## 2016-06-25 NOTE — MAU Note (Signed)
Pt states she started having lower abd cramping pain which wraps around to her back.  Coming every minute and last sometimes for 1-1.5 minutes.  Denies vaginal bleeding. White discharge which burns to pee.  Feels like sometimes she can only pee a drop.  Reports good fetal movement.

## 2016-06-25 NOTE — Discharge Instructions (Signed)
Hypertension During Pregnancy °Hypertension, or high blood pressure, is when there is extra pressure inside your blood vessels that carry blood from the heart to the rest of your body (arteries). It can happen at any time in life, including pregnancy. Hypertension during pregnancy can cause problems for you and your baby. Your baby might not weigh as much as he or she should at birth or might be born early (premature). Very bad cases of hypertension during pregnancy can be life-threatening.  °Different types of hypertension can occur during pregnancy. These include: °· Chronic hypertension. This happens when a woman has hypertension before pregnancy and it continues during pregnancy. °· Gestational hypertension. This is when hypertension develops during pregnancy. °· Preeclampsia or toxemia of pregnancy. This is a very serious type of hypertension that develops only during pregnancy. It affects the whole body and can be very dangerous for both mother and baby.   °Gestational hypertension and preeclampsia usually go away after your baby is born. Your blood pressure will likely stabilize within 6 weeks. Women who have hypertension during pregnancy have a greater chance of developing hypertension later in life or with future pregnancies. °RISK FACTORS °There are certain factors that make it more likely for you to develop hypertension during pregnancy. These include: °· Having hypertension before pregnancy. °· Having hypertension during a previous pregnancy. °· Being overweight. °· Being older than 40 years. °· Being pregnant with more than one baby. °· Having diabetes or kidney problems. °SIGNS AND SYMPTOMS °Chronic and gestational hypertension rarely cause symptoms. Preeclampsia has symptoms, which may include: °· Increased protein in your urine. Your health care provider will check for this at every prenatal visit. °· Swelling of your hands and face. °· Rapid weight gain. °· Headaches. °· Visual changes. °· Being  bothered by light. °· Abdominal pain, especially in the upper right area. °· Chest pain. °· Shortness of breath. °· Increased reflexes. °· Seizures. These occur with a more severe form of preeclampsia, called eclampsia. °DIAGNOSIS  °You may be diagnosed with hypertension during a regular prenatal exam. At each prenatal visit, you may have: °· Your blood pressure checked. °· A urine test to check for protein in your urine. °The type of hypertension you are diagnosed with depends on when you developed it. It also depends on your specific blood pressure reading. °· Developing hypertension before 20 weeks of pregnancy is consistent with chronic hypertension. °· Developing hypertension after 20 weeks of pregnancy is consistent with gestational hypertension. °· Hypertension with increased urinary protein is diagnosed as preeclampsia. °· Blood pressure measurements that stay above 160 systolic or 110 diastolic are a sign of severe preeclampsia. °TREATMENT °Treatment for hypertension during pregnancy varies. Treatment depends on the type of hypertension and how serious it is. °· If you take medicine for chronic hypertension, you may need to switch medicines. °¨ Medicines called ACE inhibitors should not be taken during pregnancy. °¨ Low-dose aspirin may be suggested for women who have risk factors for preeclampsia. °· If you have gestational hypertension, you may need to take a blood pressure medicine that is safe during pregnancy. Your health care provider will recommend the correct medicine. °· If you have severe preeclampsia, you may need to be in the hospital. Health care providers will watch you and your baby very closely. You also may need to take medicine called magnesium sulfate to prevent seizures and lower blood pressure. °· Sometimes, an early delivery is needed. This may be the case if the condition worsens. It would be   done to protect you and your baby. The only cure for preeclampsia is delivery. °· Your health  care provider may recommend that you take one low-dose aspirin (81 mg) each day to help prevent high blood pressure during your pregnancy if you are at risk for preeclampsia. You may be at risk for preeclampsia if: °¨ You had preeclampsia or eclampsia during a previous pregnancy. °¨ Your baby did not grow as expected during a previous pregnancy. °¨ You experienced preterm birth with a previous pregnancy. °¨ You experienced a separation of the placenta from the uterus (placental abruption) during a previous pregnancy. °¨ You experienced the loss of your baby during a previous pregnancy. °¨ You are pregnant with more than one baby. °¨ You have other medical conditions, such as diabetes or an autoimmune disease. °HOME CARE INSTRUCTIONS °· Schedule and keep all of your regular prenatal care appointments. This is important. °· Take medicines only as directed by your health care provider. Tell your health care provider about all medicines you take. °· Eat as little salt as possible. °· Get regular exercise. °· Do not drink alcohol. °· Do not use tobacco products. °· Do not drink products with caffeine. °· Lie on your left side when resting. °SEEK IMMEDIATE MEDICAL CARE IF: °· You have severe abdominal pain. °· You have sudden swelling in your hands, ankles, or face. °· You gain 4 pounds (1.8 kg) or more in 1 week. °· You vomit repeatedly. °· You have vaginal bleeding. °· You do not feel your baby moving as much. °· You have a headache. °· You have blurred or double vision. °· You have muscle twitching or spasms. °· You have shortness of breath. °· You have blue fingernails or lips. °· You have blood in your urine. °MAKE SURE YOU: °· Understand these instructions. °· Will watch your condition. °· Will get help right away if you are not doing well or get worse. °  °This information is not intended to replace advice given to you by your health care provider. Make sure you discuss any questions you have with your health care  provider. °  °Document Released: 08/17/2011 Document Revised: 12/20/2014 Document Reviewed: 06/28/2013 °Elsevier Interactive Patient Education ©2016 [employer] °Braxton Hicks Contractions °Contractions of the uterus can occur throughout pregnancy. Contractions are not always a sign that you are in labor.  °WHAT ARE BRAXTON HICKS CONTRACTIONS?  °Contractions that occur before labor are called Braxton Hicks contractions, or false labor. Toward the end of pregnancy (32-34 weeks), these contractions can develop more often and may become more forceful. This is not true labor because these contractions do not result in opening (dilatation) and thinning of the cervix. They are sometimes difficult to tell apart from true labor because these contractions can be forceful and people have different pain tolerances. You should not feel embarrassed if you go to the hospital with false labor. Sometimes, the only way to tell if you are in true labor is for your health care provider to look for changes in the cervix. °If there are no prenatal problems or other health problems associated with the pregnancy, it is completely safe to be sent home with false labor and await the onset of true labor. °HOW CAN YOU TELL THE DIFFERENCE BETWEEN TRUE AND FALSE LABOR? °False Labor °· The contractions of false labor are usually shorter and not as hard as those of true labor.   °· The contractions are usually irregular.   °· The contractions are often felt in   the front of the lower abdomen and in the groin.   °· The contractions may go away when you walk around or change positions while lying down.   °· The contractions get weaker and are shorter lasting as time goes on.   °· The contractions do not usually become progressively stronger, regular, and closer together as with true labor.   °True Labor °· Contractions in true labor last 30-70 seconds, become very regular, usually become more intense, and increase in frequency.   °· The  contractions do not go away with walking.   °· The discomfort is usually felt in the top of the uterus and spreads to the lower abdomen and low back.   °· True labor can be determined by your health care provider with an exam. This will show that the cervix is dilating and getting thinner.   °WHAT TO REMEMBER °· Keep up with your usual exercises and follow other instructions given by your health care provider.   °· Take medicines as directed by your health care provider.   °· Keep your regular prenatal appointments.   °· Eat and drink lightly if you think you are going into labor.   °· If Braxton Hicks contractions are making you uncomfortable:   °¨ Change your position from lying down or resting to walking, or from walking to resting.   °¨ Sit and rest in a tub of warm water.   °¨ Drink 2-3 glasses of water. Dehydration may cause these contractions.   °¨ Do slow and deep breathing several times an hour.   °WHEN SHOULD I SEEK IMMEDIATE MEDICAL CARE? °Seek immediate medical care if: °· Your contractions become stronger, more regular, and closer together.   °· You have fluid leaking or gushing from your vagina.   °· You have a fever.   °· You pass blood-tinged mucus.   °· You have vaginal bleeding.   °· You have continuous abdominal pain.   °· You have low back pain that you never had before.   °· You feel your baby's head pushing down and causing pelvic pressure.   °· Your baby is not moving as much as it used to.   °  °This information is not intended to replace advice given to you by your health care provider. Make sure you discuss any questions you have with your health care provider. °  °Document Released: 11/29/2005 Document Revised: 12/04/2013 Document Reviewed: 09/10/2013 °Elsevier Interactive Patient Education ©2016 Else[employer] Inc. ° °

## 2016-06-28 NOTE — Telephone Encounter (Signed)
PATIENT WAS SEEN IN MAU FOR HER SYMPTOMS AND DISCHARGE HOME

## 2016-06-29 ENCOUNTER — Telehealth: Payer: Self-pay | Admitting: *Deleted

## 2016-06-29 NOTE — Telephone Encounter (Addendum)
Pt left message yesterday stating that her pharmacy did not receive a prescription for bladder infection medication. Per chart review, no indication that she has a bladder infection from recent visits and tests performed. Also, provider note from 7/14 does not mention need for Tx of bladder infection.  I called pt and left a detailed message on her personal voice mail stating that no medication was prescribed for bladder infection nor did her exam and tests indicate that she has such an infection.   7/19  0855  After consult w/[place] Katrinka BlazingSmith, CNM pt was called and an additional message was left on her voice mail. I stated that a urine culture was not done @ her MAU visit on 7/14 and if she is still having pain or believes she has UTI she may come to the office and submit another urine specimen for testing and culture. Please call back if she would like to come in today.

## 2016-07-01 ENCOUNTER — Other Ambulatory Visit: Payer: BLUE CROSS/BLUE SHIELD

## 2016-07-01 NOTE — Telephone Encounter (Signed)
Patient has an appointment scheduled for 07/15/2016

## 2016-07-03 ENCOUNTER — Encounter (HOSPITAL_COMMUNITY)
Admission: AD | Disposition: A | Discharge status: Home / Self Care | Payer: Self-pay | Source: Ambulatory Visit | Attending: Family Medicine

## 2016-07-03 ENCOUNTER — Encounter (HOSPITAL_COMMUNITY): Payer: Self-pay | Admitting: *Deleted

## 2016-07-03 ENCOUNTER — Inpatient Hospital Stay (HOSPITAL_COMMUNITY): Payer: BLUE CROSS/BLUE SHIELD | Admitting: Anesthesiology

## 2016-07-03 ENCOUNTER — Inpatient Hospital Stay (HOSPITAL_COMMUNITY)
Admission: AD | Admit: 2016-07-03 | Discharge: 2016-07-06 | DRG: 765 | Disposition: A | Discharge status: Home / Self Care | Payer: BLUE CROSS/BLUE SHIELD | Source: Ambulatory Visit | Attending: Family Medicine | Admitting: Family Medicine

## 2016-07-03 DIAGNOSIS — Z8249 Family history of ischemic heart disease and other diseases of the circulatory system: Secondary | ICD-10-CM

## 2016-07-03 DIAGNOSIS — F17210 Nicotine dependence, cigarettes, uncomplicated: Secondary | ICD-10-CM | POA: Diagnosis present

## 2016-07-03 DIAGNOSIS — E039 Hypothyroidism, unspecified: Secondary | ICD-10-CM | POA: Diagnosis present

## 2016-07-03 DIAGNOSIS — O99334 Smoking (tobacco) complicating childbirth: Secondary | ICD-10-CM | POA: Diagnosis present

## 2016-07-03 DIAGNOSIS — O321XX0 Maternal care for breech presentation, not applicable or unspecified: Secondary | ICD-10-CM | POA: Diagnosis not present

## 2016-07-03 DIAGNOSIS — O99344 Other mental disorders complicating childbirth: Secondary | ICD-10-CM | POA: Diagnosis present

## 2016-07-03 DIAGNOSIS — Z91040 Latex allergy status: Secondary | ICD-10-CM

## 2016-07-03 DIAGNOSIS — Z3A36 36 weeks gestation of pregnancy: Secondary | ICD-10-CM | POA: Diagnosis not present

## 2016-07-03 DIAGNOSIS — O99284 Endocrine, nutritional and metabolic diseases complicating childbirth: Secondary | ICD-10-CM | POA: Diagnosis present

## 2016-07-03 DIAGNOSIS — F419 Anxiety disorder, unspecified: Secondary | ICD-10-CM | POA: Diagnosis present

## 2016-07-03 LAB — CBC
HCT: 34.2 % — ABNORMAL LOW (ref 36.0–46.0)
Hemoglobin: 11.7 g/dL — ABNORMAL LOW (ref 12.0–15.0)
MCH: 30.2 pg (ref 26.0–34.0)
MCHC: 34.2 g/dL (ref 30.0–36.0)
MCV: 88.1 fL (ref 78.0–100.0)
PLATELETS: 275 10*3/uL (ref 150–400)
RBC: 3.88 MIL/uL (ref 3.87–5.11)
RDW: 13.7 % (ref 11.5–15.5)
WBC: 25.6 10*3/uL — AB (ref 4.0–10.5)

## 2016-07-03 LAB — TYPE AND SCREEN
ABO/RH(D): O POS
Antibody Screen: NEGATIVE

## 2016-07-03 LAB — ABO/RH: ABO/RH(D): O POS

## 2016-07-03 SURGERY — Surgical Case
Anesthesia: Spinal | Site: Abdomen

## 2016-07-03 MED ORDER — FAMOTIDINE IN NACL 20-0.9 MG/50ML-% IV SOLN
20.0000 mg | Freq: Once | INTRAVENOUS | Status: AC
  Administered 2016-07-03: 20 mg via INTRAVENOUS
  Filled 2016-07-03: qty 50

## 2016-07-03 MED ORDER — DIBUCAINE 1 % RE OINT: 1.0000 "application " | TOPICAL_OINTMENT | RECTAL | Status: DC | PRN

## 2016-07-03 MED ORDER — NALOXONE HCL 2 MG/2ML IJ SOSY
1.0000 ug/kg/h | PREFILLED_SYRINGE | INTRAVENOUS | Status: DC | PRN
  Filled 2016-07-03: qty 2

## 2016-07-03 MED ORDER — NALBUPHINE HCL 10 MG/ML IJ SOLN
5.0000 mg | Freq: Once | INTRAMUSCULAR | Status: AC | PRN
Start: 2016-07-03 — End: 2016-07-03
  Administered 2016-07-03: 5 mg via INTRAVENOUS

## 2016-07-03 MED ORDER — OXYTOCIN 10 UNIT/ML IJ SOLN
40.0000 [IU] | INTRAVENOUS | Status: DC | PRN
  Administered 2016-07-03: 40 [IU] via INTRAVENOUS

## 2016-07-03 MED ORDER — IBUPROFEN 600 MG PO TABS
600.0000 mg | ORAL_TABLET | Freq: Four times a day (QID) | ORAL | Status: DC
  Administered 2016-07-03 – 2016-07-06 (×10): 600 mg via ORAL
  Filled 2016-07-03 (×10): qty 1

## 2016-07-03 MED ORDER — SIMETHICONE 80 MG PO CHEW
80.0000 mg | CHEWABLE_TABLET | Freq: Three times a day (TID) | ORAL | Status: DC
  Administered 2016-07-03 – 2016-07-06 (×7): 80 mg via ORAL
  Filled 2016-07-03 (×7): qty 1

## 2016-07-03 MED ORDER — HYDROMORPHONE HCL 1 MG/ML IJ SOLN: 0.2500 mg | INTRAMUSCULAR | Status: DC | PRN

## 2016-07-03 MED ORDER — MEPERIDINE HCL 25 MG/ML IJ SOLN: 6.2500 mg | INTRAMUSCULAR | Status: DC | PRN

## 2016-07-03 MED ORDER — OXYTOCIN 40 UNITS IN LACTATED RINGERS INFUSION - SIMPLE MED: 2.5000 [IU]/h | INTRAVENOUS | Status: AC

## 2016-07-03 MED ORDER — FENTANYL CITRATE (PF) 100 MCG/2ML IJ SOLN
INTRAMUSCULAR | Status: DC | PRN
  Administered 2016-07-03: 10 ug via INTRATHECAL
  Administered 2016-07-03: 30 ug via INTRAVENOUS

## 2016-07-03 MED ORDER — NALBUPHINE HCL 10 MG/ML IJ SOLN
5.0000 mg | INTRAMUSCULAR | Status: DC | PRN
  Administered 2016-07-03 (×2): 5 mg via INTRAVENOUS
  Filled 2016-07-03 (×2): qty 1

## 2016-07-03 MED ORDER — DIPHENHYDRAMINE HCL 50 MG/ML IJ SOLN: 12.5000 mg | INTRAMUSCULAR | Status: DC | PRN

## 2016-07-03 MED ORDER — OXYTOCIN 10 UNIT/ML IJ SOLN
INTRAMUSCULAR | Status: AC
  Filled 2016-07-03: qty 4

## 2016-07-03 MED ORDER — SIMETHICONE 80 MG PO CHEW
80.0000 mg | CHEWABLE_TABLET | ORAL | Status: DC
  Administered 2016-07-03 – 2016-07-05 (×3): 80 mg via ORAL
  Filled 2016-07-03 (×3): qty 1

## 2016-07-03 MED ORDER — OXYCODONE-ACETAMINOPHEN 5-325 MG PO TABS
1.0000 | ORAL_TABLET | ORAL | Status: DC | PRN
  Administered 2016-07-04: 1 via ORAL
  Filled 2016-07-03 (×2): qty 1

## 2016-07-03 MED ORDER — COCONUT OIL OIL: 1.0000 "application " | TOPICAL_OIL | Status: DC | PRN

## 2016-07-03 MED ORDER — LACTATED RINGERS IV SOLN
INTRAVENOUS | Status: DC
  Administered 2016-07-03: 20:00:00 via INTRAVENOUS

## 2016-07-03 MED ORDER — LACTATED RINGERS IV SOLN
INTRAVENOUS | Status: DC
  Administered 2016-07-03: 10:00:00 via INTRAVENOUS

## 2016-07-03 MED ORDER — SCOPOLAMINE 1 MG/3DAYS TD PT72: 1.0000 | MEDICATED_PATCH | Freq: Once | TRANSDERMAL | Status: DC

## 2016-07-03 MED ORDER — MENTHOL 3 MG MT LOZG: 1.0000 | LOZENGE | OROMUCOSAL | Status: DC | PRN

## 2016-07-03 MED ORDER — DIPHENHYDRAMINE HCL 25 MG PO CAPS
25.0000 mg | ORAL_CAPSULE | ORAL | Status: DC | PRN
  Filled 2016-07-03: qty 1

## 2016-07-03 MED ORDER — SCOPOLAMINE 1 MG/3DAYS TD PT72
MEDICATED_PATCH | TRANSDERMAL | Status: AC
  Filled 2016-07-03: qty 1

## 2016-07-03 MED ORDER — ACETAMINOPHEN 325 MG PO TABS
650.0000 mg | ORAL_TABLET | ORAL | Status: DC | PRN
Start: 2016-07-03 — End: 2016-07-06
  Administered 2016-07-04 – 2016-07-05 (×2): 650 mg via ORAL
  Filled 2016-07-03 (×3): qty 2

## 2016-07-03 MED ORDER — MORPHINE SULFATE (PF) 0.5 MG/ML IJ SOLN
INTRAMUSCULAR | Status: DC | PRN
  Administered 2016-07-03: .2 mg via INTRATHECAL

## 2016-07-03 MED ORDER — SCOPOLAMINE 1 MG/3DAYS TD PT72
MEDICATED_PATCH | TRANSDERMAL | Status: DC | PRN
  Administered 2016-07-03: 1 via TRANSDERMAL

## 2016-07-03 MED ORDER — SOD CITRATE-CITRIC ACID 500-334 MG/5ML PO SOLN
30.0000 mL | Freq: Once | ORAL | Status: AC
  Administered 2016-07-03: 30 mL via ORAL
  Filled 2016-07-03: qty 15

## 2016-07-03 MED ORDER — ACETAMINOPHEN 500 MG PO TABS
1000.0000 mg | ORAL_TABLET | Freq: Four times a day (QID) | ORAL | Status: AC
  Administered 2016-07-03 – 2016-07-04 (×2): 1000 mg via ORAL
  Filled 2016-07-03 (×2): qty 2

## 2016-07-03 MED ORDER — LEVOTHYROXINE SODIUM 25 MCG PO TABS
25.0000 ug | ORAL_TABLET | Freq: Every day | ORAL | Status: DC
  Administered 2016-07-04 – 2016-07-05 (×2): 25 ug via ORAL
  Filled 2016-07-03 (×4): qty 1

## 2016-07-03 MED ORDER — LACTATED RINGERS IV SOLN
INTRAVENOUS | Status: DC | PRN
  Administered 2016-07-03 (×4): via INTRAVENOUS

## 2016-07-03 MED ORDER — ZOLPIDEM TARTRATE 5 MG PO TABS: 5.0000 mg | ORAL_TABLET | Freq: Every evening | ORAL | Status: DC | PRN

## 2016-07-03 MED ORDER — PRENATAL MULTIVITAMIN CH
1.0000 | ORAL_TABLET | Freq: Every day | ORAL | Status: DC
  Administered 2016-07-04 – 2016-07-05 (×2): 1 via ORAL
  Filled 2016-07-03 (×2): qty 1

## 2016-07-03 MED ORDER — PHENYLEPHRINE 8 MG IN D5W 100 ML (0.08MG/ML) PREMIX OPTIME
INJECTION | INTRAVENOUS | Status: DC | PRN
  Administered 2016-07-03: 60 ug/min via INTRAVENOUS

## 2016-07-03 MED ORDER — WITCH HAZEL-GLYCERIN EX PADS: 1.0000 "application " | MEDICATED_PAD | CUTANEOUS | Status: DC | PRN

## 2016-07-03 MED ORDER — NICOTINE 7 MG/24HR TD PT24
7.0000 mg | MEDICATED_PATCH | Freq: Every day | TRANSDERMAL | Status: DC
  Administered 2016-07-04: 7 mg via TRANSDERMAL
  Filled 2016-07-03 (×5): qty 1

## 2016-07-03 MED ORDER — SENNOSIDES-DOCUSATE SODIUM 8.6-50 MG PO TABS
2.0000 | ORAL_TABLET | ORAL | Status: DC
  Administered 2016-07-03 – 2016-07-05 (×3): 2 via ORAL
  Filled 2016-07-03 (×3): qty 2

## 2016-07-03 MED ORDER — CEFAZOLIN SODIUM-DEXTROSE 2-4 GM/100ML-% IV SOLN
2.0000 g | INTRAVENOUS | Status: AC
  Administered 2016-07-03: 2 g via INTRAVENOUS
  Filled 2016-07-03: qty 100

## 2016-07-03 MED ORDER — DIPHENHYDRAMINE HCL 25 MG PO CAPS: 25.0000 mg | ORAL_CAPSULE | Freq: Four times a day (QID) | ORAL | Status: DC | PRN

## 2016-07-03 MED ORDER — DEXAMETHASONE SODIUM PHOSPHATE 10 MG/ML IJ SOLN
INTRAMUSCULAR | Status: AC
  Filled 2016-07-03: qty 1

## 2016-07-03 MED ORDER — MORPHINE SULFATE-NACL 0.5-0.9 MG/ML-% IV SOSY
PREFILLED_SYRINGE | INTRAVENOUS | Status: AC
  Filled 2016-07-03: qty 1

## 2016-07-03 MED ORDER — TETANUS-DIPHTH-ACELL PERTUSSIS 5-2.5-18.5 LF-MCG/0.5 IM SUSP: 0.5000 mL | Freq: Once | INTRAMUSCULAR | Status: DC

## 2016-07-03 MED ORDER — FENTANYL CITRATE (PF) 100 MCG/2ML IJ SOLN
INTRAMUSCULAR | Status: AC
  Filled 2016-07-03: qty 2

## 2016-07-03 MED ORDER — SODIUM CHLORIDE 0.9% FLUSH: 3.0000 mL | INTRAVENOUS | Status: DC | PRN

## 2016-07-03 MED ORDER — NALBUPHINE HCL 10 MG/ML IJ SOLN: 5.0000 mg | Freq: Once | INTRAMUSCULAR | Status: AC | PRN

## 2016-07-03 MED ORDER — ONDANSETRON HCL 4 MG/2ML IJ SOLN
INTRAMUSCULAR | Status: AC
  Filled 2016-07-03: qty 2

## 2016-07-03 MED ORDER — SIMETHICONE 80 MG PO CHEW: 80.0000 mg | CHEWABLE_TABLET | ORAL | Status: DC | PRN

## 2016-07-03 MED ORDER — OXYCODONE-ACETAMINOPHEN 5-325 MG PO TABS
2.0000 | ORAL_TABLET | ORAL | Status: DC | PRN
  Administered 2016-07-04 – 2016-07-06 (×5): 2 via ORAL
  Filled 2016-07-03 (×4): qty 2

## 2016-07-03 MED ORDER — ESCITALOPRAM OXALATE 10 MG PO TABS
10.0000 mg | ORAL_TABLET | Freq: Every day | ORAL | Status: DC
  Administered 2016-07-04 – 2016-07-06 (×3): 10 mg via ORAL
  Filled 2016-07-03 (×5): qty 1

## 2016-07-03 MED ORDER — NALBUPHINE HCL 10 MG/ML IJ SOLN
INTRAMUSCULAR | Status: AC
  Administered 2016-07-03: 5 mg via SUBCUTANEOUS
  Filled 2016-07-03: qty 1

## 2016-07-03 MED ORDER — ONDANSETRON HCL 4 MG/2ML IJ SOLN: 4.0000 mg | Freq: Three times a day (TID) | INTRAMUSCULAR | Status: DC | PRN

## 2016-07-03 MED ORDER — NALBUPHINE HCL 10 MG/ML IJ SOLN: 5.0000 mg | INTRAMUSCULAR | Status: DC | PRN

## 2016-07-03 MED ORDER — PHENYLEPHRINE 8 MG IN D5W 100 ML (0.08MG/ML) PREMIX OPTIME
INJECTION | INTRAVENOUS | Status: AC
  Filled 2016-07-03: qty 100

## 2016-07-03 MED ORDER — ONDANSETRON HCL 4 MG/2ML IJ SOLN
INTRAMUSCULAR | Status: DC | PRN
  Administered 2016-07-03: 4 mg via INTRAVENOUS

## 2016-07-03 MED ORDER — BUPIVACAINE IN DEXTROSE 0.75-8.25 % IT SOLN
INTRATHECAL | Status: DC | PRN
  Administered 2016-07-03: 1.6 mL via INTRATHECAL

## 2016-07-03 MED ORDER — LACTATED RINGERS IV BOLUS (SEPSIS)
1000.0000 mL | Freq: Once | INTRAVENOUS | Status: AC
Start: 2016-07-03 — End: 2016-07-03
  Administered 2016-07-03: 1000 mL via INTRAVENOUS

## 2016-07-03 MED ORDER — DEXAMETHASONE SODIUM PHOSPHATE 10 MG/ML IJ SOLN
INTRAMUSCULAR | Status: DC | PRN
  Administered 2016-07-03: 8 mg via INTRAVENOUS

## 2016-07-03 MED ORDER — NALOXONE HCL 0.4 MG/ML IJ SOLN: 0.4000 mg | INTRAMUSCULAR | Status: DC | PRN

## 2016-07-03 SURGICAL SUPPLY — 32 items
BENZOIN TINCTURE PRP APPL 2/3 (GAUZE/BANDAGES/DRESSINGS) ×3 IMPLANT
CHLORAPREP W/TINT 26ML (MISCELLANEOUS) ×3 IMPLANT
CLAMP CORD UMBIL (MISCELLANEOUS) ×3 IMPLANT
CLOSURE STERI STRIP 1/2 X4 (GAUZE/BANDAGES/DRESSINGS) ×2 IMPLANT
CLOSURE WOUND 1/2 X4 (GAUZE/BANDAGES/DRESSINGS) ×1
CLOTH BEACON ORANGE TIMEOUT ST (SAFETY) ×3 IMPLANT
DRSG OPSITE POSTOP 4X10 (GAUZE/BANDAGES/DRESSINGS) ×3 IMPLANT
ELECT REM PT RETURN 9FT ADLT (ELECTROSURGICAL) ×3
ELECTRODE REM PT RTRN 9FT ADLT (ELECTROSURGICAL) ×1 IMPLANT
GLOVE BIOGEL PI IND STRL 7.0 (GLOVE) ×1 IMPLANT
GLOVE BIOGEL PI IND STRL 7.5 (GLOVE) ×2 IMPLANT
GLOVE BIOGEL PI INDICATOR 7.0 (GLOVE) ×2
GLOVE BIOGEL PI INDICATOR 7.5 (GLOVE) ×4
GLOVE ECLIPSE 7.5 STRL STRAW (GLOVE) ×3 IMPLANT
GOWN STRL REUS W/TWL LRG LVL3 (GOWN DISPOSABLE) ×9 IMPLANT
NS IRRIG 1000ML POUR BTL (IV SOLUTION) ×3 IMPLANT
PACK C SECTION WH (CUSTOM PROCEDURE TRAY) ×3 IMPLANT
PAD OB MATERNITY 4.3X12.25 (PERSONAL CARE ITEMS) ×3 IMPLANT
PENCIL SMOKE EVAC W/HOLSTER (ELECTROSURGICAL) ×3 IMPLANT
RTRCTR C-SECT PINK 25CM LRG (MISCELLANEOUS) ×3 IMPLANT
STRIP CLOSURE SKIN 1/2X4 (GAUZE/BANDAGES/DRESSINGS) ×2 IMPLANT
SUT MNCRL 0 VIOLET CTX 36 (SUTURE) IMPLANT
SUT MONOCRYL 0 CTX 36 (SUTURE)
SUT PLAIN 2 0 (SUTURE) ×2
SUT PLAIN ABS 2-0 CT1 27XMFL (SUTURE) ×1 IMPLANT
SUT VIC AB 0 CTX 36 (SUTURE) ×6
SUT VIC AB 0 CTX36XBRD ANBCTRL (SUTURE) ×3 IMPLANT
SUT VIC AB 2-0 CT1 27 (SUTURE) ×4
SUT VIC AB 2-0 CT1 TAPERPNT 27 (SUTURE) ×2 IMPLANT
SUT VIC AB 4-0 KS 27 (SUTURE) ×3 IMPLANT
TOWEL OR 17X24 6PK STRL BLUE (TOWEL DISPOSABLE) ×3 IMPLANT
TRAY FOLEY CATH SILVER 14FR (SET/KITS/TRAYS/PACK) ×3 IMPLANT

## 2016-07-03 NOTE — Addendum Note (Signed)
Addendum  created 07/03/16 1836 by Shelton Silvas, MD   Modules edited: Clinical Notes   Clinical Notes:  File: [id number]

## 2016-07-03 NOTE — Transfer of Care (Signed)
Immediate Anesthesia Transfer of Care Note  Patient: Misty Mills  Procedure(s) Performed: Procedure(s): CESAREAN SECTION (N/A)  Patient Location: PACU  Anesthesia Type:Spinal  Level of Consciousness: awake, alert  and oriented  Airway & Oxygen Therapy: Patient Spontanous Breathing  Post-op Assessment: Report given to RN and Post -op Vital signs reviewed and stable  Post vital signs: Reviewed and stable  Last Vitals:  Filed Vitals:   07/03/16 0805  BP: 149/96  Pulse: 121  Temp: 36.7 C  Resp: 18    Last Pain:  Filed Vitals:   07/03/16 0958  PainSc: 10-Worst pain ever         Complications: No apparent anesthesia complications

## 2016-07-03 NOTE — Anesthesia Procedure Notes (Signed)
Spinal Patient location during procedure: OR Start time: 07/03/2016 9:25 AM End time: 07/03/2016 9:27 AM Staffing Anesthesiologist: Suella Broad D Performed by: anesthesiologist  Preanesthetic Checklist Completed: patient identified, site marked, surgical consent, pre-op evaluation, timeout performed, IV checked, risks and benefits discussed and monitors and equipment checked Spinal Block Patient position: sitting Prep: Betadine Patient monitoring: heart rate, continuous pulse ox, blood pressure and cardiac monitor Approach: midline Location: L4-5 Injection technique: single-shot Needle Needle type: Introducer and Spinocan  Needle gauge: 25 G Needle length: 9 cm Additional Notes Negative paresthesia. Negative blood return. Positive free-flowing CSF. Expiration date of kit checked and confirmed. Patient tolerated procedure well, without complications.

## 2016-07-03 NOTE — Consult Note (Addendum)
[facility]  Delivery Note:  C-section       07/03/2016  9:45 AM  I was called to the operating room at the request of the patient's obstetrician (Dr. Stinson) for a c-section due to breech presentation.   PRENATAL HX:  This is a 19 y/o G2P0101 at 36 and 1/[redacted] weeks gestation who presents in active labor.  The infant is breech so she will be delivered by c-section.  Her pregnancy has been complicated history of previous preterm birth at [redacted] weeks gestation.  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   Most recent titer from 5/31 is 1:1.  AROM at delivery.    DELIVERY:  Infant was vigorous at delivery, requiring no resuscitation other than standard warming, drying and stimulation.  APGARs 8 and 9. Pulse oximeter applied and O2 saturations in mid to high 80s by 7 minutes, comfortable work of breathing.  Infant will be observed in central nursery for the time being, to assure that oxygen saturations continue to rise.  Should she develop an oxygen requirement, or if other concerns related to late preterm delivery arise, please contact NICU.     _____________________ Electronically Signed By: Jamauri Kruzel, MD Neonatologist   

## 2016-07-03 NOTE — Addendum Note (Signed)
Addendum  created 07/03/16 1823 by Shelton Silvas, MD   Modules edited: Clinical Notes   Clinical Notes:  File: [id number]

## 2016-07-03 NOTE — MAU Note (Signed)
Pt states she is having contractions that are 2 minutes apart.   Pt states baby has not been head down.  Pt states she has not felt any leaking of fluid.

## 2016-07-03 NOTE — Op Note (Signed)
Cesarean Section Operative Report  Misty Mills  07/03/2016  Indications: Breech Presentation   Pre-operative Diagnosis: breech, labor.   Post-operative Diagnosis: Same   Surgeon: Surgeon(s) and Role:    * Levie Heritage, DO - Primary     * Jen Mow, DO - Assistant  Attending Attestation: I was present and scrubbed for the entire procedure.   Assistants: Jen Mow, DO  Anesthesia: spinal    Estimated Blood Loss: 600 ml  Total IV Fluids: 3500 ml LR  Urine Output:: 100 ml clear urine  Specimens: None  Findings: Viable female infant in frank breech presentation; Apgars 8/9; weight 5#6.8oz; clear amniotic fluid; intact placenta with three vessel cord; normal uterus, fallopian tubes and ovaries bilaterally.  Baby condition / location:  Couplet care / Skin to Skin   Complications: no complications  Indications: Misty Mills is a 20 y.o. [phone] with an IUP [redacted]w[redacted]d presenting in preterm labor with breech presentation.  The risks, benefits, complications, treatment options, and exected outcomes were discussed with the patient . The patient dwith the proposed plan, giving informed consent. identified as Misty Mills and the procedure verified as C-Section Delivery.  Procedure Details:  The patient was taken back to the operative suite where spinal anesthesia was placed.  A time out was held and the above information confirmed.   After induction of anesthesia, the patient was draped and prepped in the usual sterile manner and placed in a dorsal supine position with a leftward tilt. A Pfannenstiel incision was made and carried down through the subcutaneous tissue to the fascia. Fascial incision was made and extended transversely. The fascia was separated from the underlying rectus tissue superiorly and inferiorly. The peritoneum was identified and entered and extended longitudinally. Alexis retractor was placed. A low transverse uterine incision was made and extended  bluntly. Delivered from frank breech presentation was a viable infant with Apgars and weight as above.  After waiting 60 seconds for delayed cord cutting, the umbilical cord was clamped and cut cord blood was obtained for evaluation. Cord ph was not sent. The placenta was removed Intact and appeared normal. The uterine outline, tubes and ovaries appeared normal. The uterine incision was closed with running locked sutures of 0 Vicryl with an imbricating layer of the same.   Hemostasis was observed. The peritoneum was closed with 2-0 Vicryl. The rectus muscles were examined and hemostasis observed. The fascia was then reapproximated with running sutures of 0 Vicryl. The subcuticular closure was performed using 2-0plain gut. The skin was closed with 4-0 Vicryl.   Instrument, sponge, and needle counts were correct prior the abdominal closure and were correct at the conclusion of the case.     Disposition: PACU - hemodynamically stable.   Maternal Condition: stable       SignedJustice Deeds 07/03/2016 10:51 AM

## 2016-07-03 NOTE — H&P (Signed)
LABOR AND DELIVERY ADMISSION HISTORY AND PHYSICAL NOTE  Lenisha Kobes is a 20 y.o. female G2P0101 with IUP at [redacted]w[redacted]d by FTUS (per 19 wk [place], cannot find) confirmed by 19wk [place] presenting for preterm labor with breech presentation.  States contractions started this AM q52min apart and strong at 3AM. She reports positive fetal movement. She denies leakage of fluid or vaginal bleeding.  It was discussed with the patient risks/benefits of breech vaginal delivery versus cesarean section, patient elected to have cesarean performed.    Prenatal History/Complications:  Hypothyroidism, smoking in pregnancy, h/o preterm birth from PTL, IBS, h/o preeclampsia, anxiety, abnormal 1 hr GTT but no 3 hr GTT performed, syphilis in 2nd trimester that was successfully treated.   Past Medical History: Past Medical History  Diagnosis Date  . Hypothyroidism   . Colitis   . Gastritis   . Preeclampsia   . Irritable bowel   . Preterm labor   . Anxiety   . Hypokalemia     Past Surgical History: Past Surgical History  Procedure Laterality Date  . Appendectomy    . Cholecystectomy      Obstetrical History: OB History    Gravida Para Term Preterm AB TAB SAB Ectopic Multiple Living   2 1  1      1       Social History: Social History   Social History  . Marital Status: Married    Spouse Name: N/A  . Number of Children: N/A  . Years of Education: N/A   Social History Main Topics  . Smoking status: Current Every Day Smoker -- 0.50 packs/day    Types: Cigarettes  . Smokeless tobacco: Never Used  . Alcohol Use: No  . Drug Use: No  . Sexual Activity: Yes    Birth Control/ Protection: None   Other Topics Concern  . None   Social History Narrative    Family History: Family History  Problem Relation Age of Onset  . Hypothyroidism Mother   . Hypertension Mother   . Mental illness Father     Allergies: Allergies  Allergen Reactions  . Latex Other (See Comments)    Causes irritation.  .  Morphine Other (See Comments)    Makes pain worse    Prescriptions prior to admission  Medication Sig Dispense Refill Last Dose  . butalbital-acetaminophen-caffeine (FIORICET) 50-325-40 MG tablet Take 1-2 tablets by mouth every 6 (six) hours as needed for headache. 30 tablet 0 06/25/2016 at Unknown time  . Doxylamine-Pyridoxine (DICLEGIS) 10-10 MG TBEC Take 2 tablets by mouth daily. Take 2 tablets at bedtime. If symptoms persist, add 1 tab in the AM starting on day 3. If symptoms persist, add 1 tab in the PM starting day 4. (Patient taking differently: Take 2 tablets by mouth 2 (two) times daily. ) 90 tablet 1 06/25/2016 at Unknown time  . escitalopram (LEXAPRO) 10 MG tablet Take 10 mg by mouth daily.    06/24/2016 at Unknown time  . levothyroxine (LEVOTHROID) 25 MCG tablet Take 25 mcg by mouth daily before breakfast.    06/25/2016 at Unknown time  . Prenatal Vit-Fe Fumarate-FA (PRENATAL VITAMINS PLUS) 27-1 MG TABS Take 1 tablet by mouth daily. 30 tablet 6 06/25/2016 at Unknown time  . ranitidine (ZANTAC) 150 MG tablet Take 1 tablet (150 mg total) by mouth 2 (two) times daily. 60 tablet 3 06/25/2016 at Unknown time     Review of Systems   All systems reviewed and negative except as stated in HPI  Blood  pressure 149/96, pulse 121, temperature 98.1 F (36.7 C), temperature source Oral, resp. rate 18, last menstrual period 10/01/2015. General appearance: alert, cooperative, appears stated age and no distress Lungs: clear to auscultation bilaterally Heart: regular rate and rhythm Abdomen: soft, non-tender; bowel sounds normal Extremities: No calf swelling or tenderness Presentation: breech Fetal monitoring: 130, mod var, +accels, -decels Uterine activity: q2-3 min  Dilation: 3.5 Effacement (%): 40 Exam by:: Ronna Polio, RN   Prenatal labs: ABO, Rh: O/POS/-- (02/16 1616) Antibody: NEG (02/16 1616) Rubella: !Error!  IMMUNE RPR: REACTIVE (05/31 0001)  HBsAg: NEGATIVE (02/16 1616)   HIV: NONREACTIVE (05/31 0001)  GBS:   Not performed yet 1 hr Glucola: 146 - elevated (did not perform 3 hr GTT) Genetic screening:  declined Anatomy US: Normal, breech  Prenatal Transfer Tool  Maternal Diabetes: No But abnormal 1 hr GTT, never performed 3 hr Genetic Screening: Declined Maternal Ultrasounds/Referrals: Normal Fetal Ultrasounds or other Referrals:  None Maternal Substance Abuse:  Yes:  Type: Smoker Significant Maternal Medications:  Meds include: Zantac Syntroid Other:   Lexapro, Diclegis Significant Maternal Lab Results: Lab values include: Other:   +Syphilis in pregnancy, treated successfully  No results found for this or any previous visit (from the past 24 hour(s)).  Patient Active Problem List   Diagnosis Date Noted  . Supervision of low-risk pregnancy 06/10/2016  . Hypokalemia 05/20/2016  . Syphilis affecting pregnancy in second trimester, antepartum 02/02/2016  . IBS (irritable bowel syndrome) 01/29/2016  . Hypothyroidism 01/29/2016  . Generalized anxiety disorder 01/29/2016  . Tobacco use 01/29/2016  . Hx of preeclampsia, prior pregnancy, currently pregnant 01/29/2016  . History of pre-term labor 01/29/2016    Assessment: Alexxus Sobh is a 20 y.o. G2P0101 at [redacted]w[redacted]d here for preterm labor with breech presentation.  LATEX allergy Cesarean to be performed Routine intrapartum care H/o Syphilis in current pregnancy, s/p treatment Labs to be performed, await FT-ABS with RPR titer   Jen Mow, DO 07/03/2016, 8:49 AM

## 2016-07-03 NOTE — Anesthesia Preprocedure Evaluation (Signed)
Anesthesia Evaluation  Patient identified by MRN, date of birth, ID band Patient awake    Reviewed: Allergy & Precautions, H&P , Patient's Chart, lab work & pertinent test results  Airway Mallampati: II  TM Distance: >3 FB Neck ROM: full    Dental no notable dental hx.    Pulmonary Current Smoker,    Pulmonary exam normal breath sounds clear to auscultation       Cardiovascular Exercise Tolerance: Good hypertension,  Rhythm:regular Rate:Normal     Neuro/Psych    GI/Hepatic   Endo/Other    Renal/GU      Musculoskeletal   Abdominal   Peds  Hematology   Anesthesia Other Findings   Reproductive/Obstetrics                             Anesthesia Physical Anesthesia Plan  ASA: II and emergent  Anesthesia Plan: Spinal   Post-op Pain Management:    Induction:   Airway Management Planned:   Additional Equipment:   Intra-op Plan:   Post-operative Plan:   Informed Consent: I have reviewed the patients History and Physical, chart, labs and discussed the procedure including the risks, benefits and alternatives for the proposed anesthesia with the patient or authorized representative who has indicated his/her understanding and acceptance.   Dental Advisory Given  Plan Discussed with: CRNA  Anesthesia Plan Comments: (Lab work confirmed with CRNA in room. Platelets okay. Discussed spinal anesthetic, and patient consents to the procedure:  included risk of possible headache,backache, failed block, allergic reaction, and nerve injury. This patient was asked if she had any questions or concerns before the procedure started. )        Anesthesia Quick Evaluation

## 2016-07-03 NOTE — Progress Notes (Signed)
Notified Dr. Emelda Fear patient presents in labor 4 cm breech presentation.

## 2016-07-03 NOTE — Anesthesia Postprocedure Evaluation (Addendum)
Anesthesia Post Note  Patient: Misty Mills  Procedure(s) Performed: Procedure(s) (LRB): CESAREAN SECTION (N/A)  Patient location during evaluation: PACU Anesthesia Type: Spinal Level of consciousness: oriented and awake and alert Pain management: pain level controlled Vital Signs Assessment: post-procedure vital signs reviewed and stable Respiratory status: spontaneous breathing, respiratory function stable and patient connected to nasal cannula oxygen Cardiovascular status: blood pressure returned to baseline and stable Postop Assessment: no headache, no backache and spinal receding Anesthetic complications: no     Last Vitals:  Filed Vitals:   07/03/16 1130 07/03/16 1135  BP:  117/55  Pulse: 65 62  Temp:    Resp: 17 17    Last Pain:  Filed Vitals:   07/03/16 1136  PainSc: 1    Pain Goal:                 Shelton Silvas

## 2016-07-04 LAB — CBC
HEMATOCRIT: 25.7 % — AB (ref 36.0–46.0)
Hemoglobin: 8.7 g/dL — ABNORMAL LOW (ref 12.0–15.0)
MCH: 29.7 pg (ref 26.0–34.0)
MCHC: 33.9 g/dL (ref 30.0–36.0)
MCV: 87.7 fL (ref 78.0–100.0)
PLATELETS: 207 10*3/uL (ref 150–400)
RBC: 2.93 MIL/uL — ABNORMAL LOW (ref 3.87–5.11)
RDW: 13.7 % (ref 11.5–15.5)
WBC: 25.3 10*3/uL — AB (ref 4.0–10.5)

## 2016-07-04 NOTE — Progress Notes (Signed)
Subjective: Postpartum Day 1: Cesarean Delivery Patient reports incisional pain, tolerating PO and + flatus.    Objective: Vital signs in last 24 hours: Temp:  [97.3 F (36.3 C)-98.7 F (37.1 C)] 97.8 F (36.6 C) (07/22 2340) Pulse Rate:  [44-121] 55 (07/23 0330) Resp:  [13-98] 98 (07/23 0330) BP: (98-149)/(53-96) 110/53 (07/23 0330) SpO2:  [95 %-100 %] 98 % (07/23 0330)  Physical Exam:  General: alert, cooperative, appears stated age and no distress Lochia: appropriate Uterine Fundus: firm Incision: healing well, no significant drainage, no dehiscence, no significant erythema DVT Evaluation: No evidence of DVT seen on physical exam.   Recent Labs  07/03/16 0811 07/04/16 0513  HGB 11.7* 8.7*  HCT 34.2* 25.7*    Assessment/Plan: Status post Cesarean section. Doing well postoperatively.  Continue current care.  Wyvonnia Dusky 07/04/2016, 7:45 AM

## 2016-07-04 NOTE — Anesthesia Postprocedure Evaluation (Signed)
Anesthesia Post Note  Patient: Misty Mills  Procedure(s) Performed: Procedure(s) (LRB): CESAREAN SECTION (N/A)  Patient location during evaluation: Mother Baby Anesthesia Type: Spinal Level of consciousness: oriented and awake and alert Pain management: pain level controlled Vital Signs Assessment: post-procedure vital signs reviewed and stable Respiratory status: spontaneous breathing, respiratory function stable and patient connected to nasal cannula oxygen Cardiovascular status: blood pressure returned to baseline and stable Postop Assessment: no headache, no backache and patient able to bend at knees Anesthetic complications: no     Last Vitals:  Vitals:   07/03/16 2340 07/04/16 0330  BP: 114/60 (!) 110/53  Pulse: (!) 57 (!) 55  Resp: 18 (!) 98  Temp: 36.6 C     Last Pain:  Vitals:   07/04/16 0611  TempSrc:   PainSc: 0-No pain   Pain Goal:                 Rica Records

## 2016-07-04 NOTE — Addendum Note (Signed)
Addendum  created 07/04/16 0905 by Rica Records, CRNA   Sign clinical note

## 2016-07-04 NOTE — Clinical Social Work Maternal (Signed)
  CLINICAL SOCIAL WORK MATERNAL/CHILD NOTE  Patient Details  Name: Misty Mills MRN: [id number] Date of Birth: 01/10/1996  Date:  07/04/2016  Clinical Social Worker Initiating Note:   (Keajah Killough lcsw) Date/ Time Initiated:  07/04/16/      Child's Name:      Legal Guardian:  Mother   Need for Interpreter:  None   Date of Referral:  07/03/16     Reason for Referral:  [place], including SI    Referral Source:  RN   Address:   ([place] [address])  Phone number:  [phone]   Household Members:  Self, Spouse, Minor Children   Natural Supports (not living in the home):  Spouse/significant other, Extended Family, Immediate Family   Professional Supports: None   Employment: Homemaker   Type of Work:     Education:  Database administrator Resources:  Multimedia programmer, [place]   Other Resources:  [facility]   Cultural/Religious Considerations Which May Impact Care:  none noted Strengths:  Ability to meet basic needs , Home prepared for child , Pediatrician chosen    Risk Factors/Current Problems:  None   Cognitive State:  Alert , Insightful    Mood/Affect:  Bright , Happy    CSW Assessment:  CSW met with pt/spouse to discuss referral for pt's hx of anxiety.  Pt admits to diagnosis and states that she currently takes Lexapro and will continue taking it, post d/c.  Pt denied feeling anxious at the time of CSW visit.  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   PPD discussed and pt is agreeable to f/u with MD for changes in mood behavior at d/c.  Pt/husband state that they have everything they need for their daughter and identify a supportive family.   During [place] visit, pt excited that her 51 month old daughter is en route to [facility] to meet her baby sister.  No other social work needs identified.  Emotional support provided. CSW Plan/Description:  No Further Intervention Required/No  Barriers to Discharge    Roanna Raider, LCSW 07/04/2016, 2:17 PM

## 2016-07-04 NOTE — Progress Notes (Signed)
Rn called Dr. Chanetta Marshall regarding patient 's low heart rate of 44 when sleeping. Other vital signs are normal. RN stated that heart was in the fifties when patient was walking and stood up. No further orders were given.

## 2016-07-04 NOTE — Progress Notes (Signed)
Post Partum Day 1  Subjective:  Misty Mills is a 20 y.o. W8E3212 [redacted]w[redacted]d s/p PLTCS for breech.  No acute events overnight.  Pt denies problems with ambulating, voiding or po intake.  She denies nausea or vomiting.  Pain is well controlled.  She has had flatus. She has not had bowel movement.  Lochia Minimal.  Plan for birth control is IUD.  Method of Feeding: bottle  Objective: BP (!) 110/53 (BP Location: Right Arm)   Pulse (!) 55   Temp 97.8 F (36.6 C)   Resp (!) 98   LMP 10/01/2015   SpO2 98%   Breastfeeding? Unknown   Physical Exam:  General: alert, cooperative and no distress Lochia:normal flow Chest: CTAB Heart: RRR no m/r/g Abdomen: +BS, soft, nontender, fundus firm at/below umbilicus Uterine Fundus: firm, nontender; dressing clean and intact DVT Evaluation: No evidence of DVT seen on physical exam. Extremities: no edema   Recent Labs  07/03/16 0811 07/04/16 0513  HGB 11.7* 8.7*  HCT 34.2* 25.7*    Assessment/Plan:  ASSESSMENT: Misty Mills is a 20 y.o. Y4M2500 [redacted]w[redacted]d pod #1 s/p PLTCS doing well.   Plan for discharge tomorrow   LOS: 1 day   Misty Mills 07/04/2016, 7:20 AM

## 2016-07-05 LAB — CBC WITH DIFFERENTIAL/PLATELET
BASOS ABS: 0 10*3/uL (ref 0.0–0.1)
Basophils Relative: 0 %
EOS ABS: 0.5 10*3/uL (ref 0.0–0.7)
EOS PCT: 3 %
HEMATOCRIT: 28.7 % — AB (ref 36.0–46.0)
HEMOGLOBIN: 9.6 g/dL — AB (ref 12.0–15.0)
LYMPHS ABS: 5.5 10*3/uL — AB (ref 0.7–4.0)
Lymphocytes Relative: 30 %
MCH: 29.7 pg (ref 26.0–34.0)
MCHC: 33.4 g/dL (ref 30.0–36.0)
MCV: 88.9 fL (ref 78.0–100.0)
MONOS PCT: 4 %
Monocytes Absolute: 0.7 10*3/uL (ref 0.1–1.0)
NEUTROS ABS: 11.5 10*3/uL — AB (ref 1.7–7.7)
NEUTROS PCT: 63 %
Other: 0 %
PLATELETS: 264 10*3/uL (ref 150–400)
RBC: 3.23 MIL/uL — AB (ref 3.87–5.11)
RDW: 13.7 % (ref 11.5–15.5)
WBC: 18.2 10*3/uL — AB (ref 4.0–10.5)

## 2016-07-05 LAB — PROTEIN / CREATININE RATIO, URINE
Creatinine, Urine: 37 mg/dL
Protein Creatinine Ratio: 0.27 mg/mg{Cre} — ABNORMAL HIGH (ref 0.00–0.15)
Total Protein, Urine: 10 mg/dL

## 2016-07-05 LAB — COMPREHENSIVE METABOLIC PANEL
ALT: 22 U/L (ref 14–54)
AST: 32 U/L (ref 15–41)
Albumin: 3 g/dL — ABNORMAL LOW (ref 3.5–5.0)
Alkaline Phosphatase: 111 U/L (ref 38–126)
Anion gap: 5 (ref 5–15)
BILIRUBIN TOTAL: 0.3 mg/dL (ref 0.3–1.2)
BUN: 7 mg/dL (ref 6–20)
CHLORIDE: 107 mmol/L (ref 101–111)
CO2: 25 mmol/L (ref 22–32)
Calcium: 9 mg/dL (ref 8.9–10.3)
Creatinine, Ser: 0.83 mg/dL (ref 0.44–1.00)
Glucose, Bld: 85 mg/dL (ref 65–99)
POTASSIUM: 3.3 mmol/L — AB (ref 3.5–5.1)
SODIUM: 137 mmol/L (ref 135–145)
TOTAL PROTEIN: 6.9 g/dL (ref 6.5–8.1)

## 2016-07-05 LAB — RPR: RPR: REACTIVE — AB

## 2016-07-05 LAB — RPR, QUANT+TP ABS (REFLEX)
Rapid Plasma Reagin, Quant: 1:1 {titer} — ABNORMAL HIGH
TREPONEMA PALLIDUM AB: NEGATIVE

## 2016-07-05 MED ORDER — OXYCODONE-ACETAMINOPHEN 5-325 MG PO TABS: 2.0000 | ORAL_TABLET | ORAL | 0 refills | Status: DC | PRN

## 2016-07-05 MED ORDER — FENTANYL CITRATE (PF) 100 MCG/2ML IJ SOLN: 100.0000 ug | Freq: Once | INTRAMUSCULAR | Status: DC

## 2016-07-05 MED ORDER — ONDANSETRON HCL 4 MG PO TABS
4.0000 mg | ORAL_TABLET | Freq: Three times a day (TID) | ORAL | Status: DC | PRN
  Administered 2016-07-05 – 2016-07-06 (×3): 4 mg via ORAL
  Filled 2016-07-05 (×3): qty 1

## 2016-07-05 MED ORDER — IBUPROFEN 600 MG PO TABS: 600.0000 mg | ORAL_TABLET | Freq: Four times a day (QID) | ORAL | 0 refills | Status: DC

## 2016-07-05 NOTE — Progress Notes (Signed)
Pt is POD#2 s/p CS and requests to stay one more day. Baby will not be D/Cd today and she asks to remain with baby until tomorrow. DC postponed, plan on DC tomorrow.  Andres Ege, MD, PGY-1, MPH

## 2016-07-05 NOTE — Progress Notes (Signed)
Patient's bp 147/88 and c/o severe headache.  She has been given 2 percocet and then scheduled motrin but nothing has really helped the headache.  Notified MD since bp higher than normal as well.  Will continue to monitor.  Vivi Martens RN

## 2016-07-05 NOTE — Discharge Summary (Signed)
OB Discharge Summary  Patient Name: Misty Mills DOB: [birth year] MRN: [id number]  Date of admission: 07/03/2016 Delivering MD: Levie Heritage   Date of discharge: 07/05/2016  Admitting diagnosis: 36 WEEKS CONTRACTIONS Intrauterine pregnancy: 107w1d     Secondary diagnosis:Active Problems:   Preterm labor in third trimester with preterm delivery   Cesarean delivery delivered   Breech presentation delivered   Cesarean delivery, delivered, current hospitalization  Additional problems:none     Discharge diagnosis: Preterm Pregnancy Delivered                                                                     Post partum procedures:none  Augmentation: n/a  Complications: None  Hospital course:  Onset of Labor With Unplanned C/S  20 y.o. yo G2P0202 at [redacted]w[redacted]d was admitted in Latent Labor on 07/03/2016. Patient had a labor course significant for breech . Membrane Rupture Time/Date: 9:48 AM ,07/03/2016   The patient went for cesarean section due to Malpresentation, and delivered a Viable infant,07/03/2016  Details of operation can be found in separate operative note. Patient had an uncomplicated postpartum course.  She is ambulating,tolerating a regular diet, passing flatus, and urinating well.  Patient is discharged home in stable condition 07/05/16.   Physical exam Vitals:   07/04/16 0330 07/04/16 1524 07/04/16 1832 07/05/16 0607  BP: (!) 110/53 98/66 123/73 129/89  Pulse: (!) 55 65 (!) 46 (!) 43  Resp: (!) 98 Temp:  98.1 F (36.7 C) 97.7 F (36.5 C) 97.5 F (36.4 C)  TempSrc: Oral  Oral   SpO2: 98%  100%    General: alert, cooperative and no distress Lochia: appropriate Uterine Fundus: firm Incision: Healing well with no significant drainage, No significant erythema, Dressing is clean, dry, and intact DVT Evaluation: No evidence of DVT seen on physical exam. Negative Homan's sign. Labs: Lab Results  Component Value Date   WBC 25.3 (H) 07/04/2016   HGB  8.7 (L) 07/04/2016   HCT 25.7 (L) 07/04/2016   MCV 87.7 07/04/2016   PLT 207 07/04/2016   CMP Latest Ref Rng & Units 06/25/2016  Glucose 65 - 99 mg/dL 93  BUN 6 - 20 mg/dL <4(U)  Creatinine 9.81 - 1.00 mg/dL 1.91  Sodium 478 - 295 mmol/L 137  Potassium 3.5 - 5.1 mmol/L 3.0(L)  Chloride 101 - 111 mmol/L 110  CO2 22 - 32 mmol/L 20(L)  Calcium 8.9 - 10.3 mg/dL 9.2  Total Protein 6.5 - 8.1 g/dL 6.3(L)  Total Bilirubin 0.3 - 1.2 mg/dL 0.3  Alkaline Phos 38 - 126 U/L 123  AST 15 - 41 U/L 23  ALT 14 - 54 U/L 19    Discharge instruction: per After Visit Summary and "Baby and Me Booklet".  After Visit Meds:    Medication List    STOP taking these medications   Doxylamine-Pyridoxine 10-10 MG Tbec Commonly known as:  DICLEGIS     TAKE these medications   butalbital-acetaminophen-caffeine 50-325-40 MG tablet Commonly known as:  FIORICET Take 1-2 tablets by mouth every 6 (six) hours as needed for headache.   escitalopram 10 MG tablet Commonly known as:  LEXAPRO Take 10 mg by mouth daily.   ibuprofen 600 MG tablet  Commonly known as:  ADVIL,MOTRIN Take 1 tablet (600 mg total) by mouth every 6 (six) hours.   LEVOTHROID 25 MCG tablet Generic drug:  levothyroxine Take 25 mcg by mouth daily before breakfast.   oxyCODONE-acetaminophen 5-325 MG tablet Commonly known as:  PERCOCET/ROXICET Take 2 tablets by mouth every 4 (four) hours as needed (pain scale > 7).   PRENATAL VITAMINS PLUS 27-1 MG Tabs Take 1 tablet by mouth daily.   ranitidine 150 MG tablet Commonly known as:  ZANTAC Take 1 tablet (150 mg total) by mouth 2 (two) times daily.       Diet: routine diet  Activity: Advance as tolerated. Pelvic rest for 6 weeks.   Outpatient follow up:6 weeks Follow up Appt:Future Appointments Date Time Provider Department Center  07/14/2016 8:20 AM Duane Lope, NP [facility]   Follow up visit: No Follow-up on file.  Postpartum contraception: IUD Mirena  Newborn  Data: Live born female  Birth Weight: 5 lb 6.8 oz (2460 g) APGAR: ,   Baby Feeding: Bottle Disposition:home with mother   20/24/2017 Wyvonnia Dusky, CNM

## 2016-07-06 LAB — GLUCOSE, CAPILLARY: GLUCOSE-CAPILLARY: 106 mg/dL — AB (ref 65–99)

## 2016-07-06 MED ORDER — OXYCODONE-ACETAMINOPHEN 5-325 MG PO TABS
2.0000 | ORAL_TABLET | ORAL | Status: DC | PRN
  Administered 2016-07-06: 2 via ORAL
  Filled 2016-07-06: qty 2

## 2016-07-06 MED ORDER — OXYCODONE-ACETAMINOPHEN 5-325 MG PO TABS: 2.0000 | ORAL_TABLET | ORAL | 0 refills | Status: DC | PRN

## 2016-07-06 NOTE — Discharge Instructions (Signed)

## 2016-07-06 NOTE — Progress Notes (Signed)
Patient's headache that she had yesterday and during the night returned.  Pain was a 7/10, and this RN checked bp.  It was 160/87; patient was frustrated because she had been waiting so long for her daughter's phototherapy lights for home to be delivered to her here at the hospital.  She also had not eaten since the evening before, so this RN encouraged her to order food, eat, and rest while we waited for the lights to arrive.  After eating, her headache was gone, but upon recheck of bp (manually)  it was 167/98.  MD was notified and updated about patient's status.  MD still ok'd D/C; patient is to return to the clinic in three days for a bp recheck.  Symptoms to return to [facility] for were discussed with patient prior to D/C.

## 2016-07-06 NOTE — Discharge Summary (Signed)
Obstetric Discharge Summary Reason for Admission: onset of labor and breech presentation Prenatal Procedures: +syphillis-s/p treatment Intrapartum Procedures: cesarean: low cervical, transverse Postpartum Procedures: none Complications-Operative and Postpartum: 2 elevated BP values, pre-e labs neg Hemoglobin  Date Value Ref Range Status  07/05/2016 9.6 (L) 12.0 - 15.0 g/dL Final   HCT  Date Value Ref Range Status  07/05/2016 28.7 (L) 36.0 - 46.0 % Final    Physical Exam:  General: alert, cooperative and no distress Lochia: appropriate Uterine Fundus: firm Incision: healing well, no significant drainage, no dehiscence, no significant erythema DVT Evaluation: No evidence of DVT seen on physical exam. Negative Homan's sign. No cords or calf tenderness. No significant calf/ankle edema.  Discharge Diagnoses: preterm pregnancy, delivered  Discharge Information: Date: 07/06/2016 Activity: pelvic rest Diet: routine Medications: PNV and Percocet Condition: stable Instructions: refer to practice specific booklet Discharge to: home Follow-up Information    [facility] Follow up in 3 day(s).   Specialty:  Obstetrics and Gynecology Why:  for blood pressure check Contact information: [address] [place] [address] [phone]          Newborn Data: Live born female  Birth Weight: 5 lb 6.8 oz (2460 g) APGAR: ,   Home with mother.  Donette Larry, CNM 07/06/2016, 7:56 AM

## 2016-07-14 ENCOUNTER — Encounter: Payer: BLUE CROSS/BLUE SHIELD | Admitting: Obstetrics and Gynecology

## 2016-07-19 ENCOUNTER — Encounter (HOSPITAL_COMMUNITY): Payer: Self-pay | Admitting: *Deleted

## 2016-07-29 ENCOUNTER — Encounter: Payer: Self-pay | Admitting: *Deleted

## 2016-08-30 ENCOUNTER — Ambulatory Visit (INDEPENDENT_AMBULATORY_CARE_PROVIDER_SITE_OTHER): Payer: BLUE CROSS/BLUE SHIELD | Admitting: Family Medicine

## 2016-08-30 ENCOUNTER — Ambulatory Visit (INDEPENDENT_AMBULATORY_CARE_PROVIDER_SITE_OTHER): Payer: Self-pay | Admitting: Clinical

## 2016-08-30 ENCOUNTER — Encounter: Payer: Self-pay | Admitting: Family Medicine

## 2016-08-30 VITALS — BP 125/90 | HR 83 | Ht 63.0 in | Wt 132.0 lb

## 2016-08-30 DIAGNOSIS — F411 Generalized anxiety disorder: Secondary | ICD-10-CM

## 2016-08-30 DIAGNOSIS — K5900 Constipation, unspecified: Secondary | ICD-10-CM

## 2016-08-30 DIAGNOSIS — Z3490 Encounter for supervision of normal pregnancy, unspecified, unspecified trimester: Secondary | ICD-10-CM

## 2016-08-30 DIAGNOSIS — E039 Hypothyroidism, unspecified: Secondary | ICD-10-CM

## 2016-08-30 DIAGNOSIS — K589 Irritable bowel syndrome without diarrhea: Secondary | ICD-10-CM

## 2016-08-30 MED ORDER — ESCITALOPRAM OXALATE 20 MG PO TABS: 20.0000 mg | ORAL_TABLET | Freq: Every day | ORAL | 1 refills | Status: AC

## 2016-08-30 MED ORDER — POLYETHYLENE GLYCOL 3350 17 G PO PACK
17.0000 g | PACK | Freq: Every day | ORAL | 1 refills | Status: AC
Start: 2016-08-30 — End: ?

## 2016-08-30 MED ORDER — DOCUSATE SODIUM 100 MG PO CAPS: 100.0000 mg | ORAL_CAPSULE | Freq: Two times a day (BID) | ORAL | 1 refills | Status: AC

## 2016-08-30 NOTE — Progress Notes (Signed)
Subjective:     Misty Mills is a 20 y.o. female who presents for a postpartum visit. She is 8 weeks postpartum following a low cervical transverse Cesarean section. I have fully reviewed the prenatal and intrapartum course. The delivery was at 36.1 gestational weeks. Outcome: primary cesarean section, low transverse incision. Anesthesia: spinal. Postpartum course has been unremarkable. Baby's course has been unremarkable. Baby is feeding by bottle - Similac Neosure. Bleeding no bleeding. Bowel function is abnormal: constipation with vomiting . Bladder function is normal. Patient is sexually active. Contraception method is condoms. Patient is interested in the IUD. Depression screening: negative.  Feels anxiety/panicking is getting worse. Not sleeping well at nighttime. Feels mood is labile. No feelings of harming self or baby or others. Used to see a BHT but did not feel comfortable with them so stopped seeing. C/o Constipation. Has BM 3-4 days apart, hard stools, causing vomiting. Not using pain meds, barely used after delivery.  Resumed intercourse, last unprotected sex was 3 days. Using condoms otherwise. Discussed importance of abstaining or using protection before IUD can be inserted. To return in 2 weeks for pregnancy test and for IUD insertion.  The following portions of the patient's history were reviewed and updated as appropriate: allergies, current medications, past family history, past medical history, past social history, past surgical history and problem list.  Review of Systems Pertinent items noted in HPI and remainder of comprehensive ROS otherwise negative.   Objective:    Ht 5\' 3"  (1.6 m)   Wt 132 lb (59.9 kg)   BMI 23.38 kg/m   General:  alert, cooperative, appears stated age and no distress   Breasts:  negative  Lungs: clear to auscultation bilaterally  Heart:  regular rate and rhythm, S1, S2 normal, no murmur, click, rub or gallop  Abdomen: soft, non-tender; bowel  sounds normal; no masses,  no organomegaly. Incision is clean, intact, healed.  Extremities: No edema        Assessment:    1. Postpartum care and examination - 8 weeks postpartum - PAP not indicated, patient <21 y/o - RPR test, positive early in pregnancy, s/p treatment and follow up neg RPR tests in repeat visits  2. Constipation, unspecified constipation type - docusate sodium (COLACE) 100 MG capsule; Take 1 capsule (100 mg total) by mouth 2 (two) times daily.  Dispense: 60 capsule; Refill: 1 - polyethylene glycol (MIRALAX) packet; Take 17 g by mouth daily.  Dispense: 14 each; Refill: 1  3. IBS (irritable bowel syndrome) - F/U with GI if bowel regimen above does not resolve symptoms  4. Hypothyroidism, unspecified hypothyroidism type - TSH to be checked today  5. Generalized anxiety disorder - Increasing dose of Lexapro - Jaime (office BHT) seeing patient today - escitalopram (LEXAPRO) 20 MG tablet; Take 1 tablet (20 mg total) by mouth daily.  Dispense: 30 tablet; Refill: 1   Return in about 2 weeks (around 09/13/2016) for IUD placement (2-3 weeks).

## 2016-08-30 NOTE — Addendum Note (Signed)
Addended by: Gerome ApleyZEYFANG, Jamaia Brum L on: 08/30/2016 05:14 PM   Modules accepted: Orders

## 2016-08-30 NOTE — Patient Instructions (Signed)
Postpartum Depression and Baby Blues °The postpartum period begins right after the birth of a baby. During this time, there is often a great amount of joy and excitement. It is also a time of many changes in the life of the parents. Regardless of how many times a mother gives birth, each child brings new challenges and dynamics to the family. It is not unusual to have feelings of excitement along with confusing shifts in moods, emotions, and thoughts. All mothers are at risk of developing postpartum depression or the "baby blues." These mood changes can occur right after giving birth, or they may occur many months after giving birth. The baby blues or postpartum depression can be mild or severe. Additionally, postpartum depression can go away rather quickly, or it can be a long-term condition.  °CAUSES °Raised hormone levels and the rapid drop in those levels are thought to be a main cause of postpartum depression and the baby blues. A number of hormones change during and after pregnancy. Estrogen and progesterone usually decrease right after the delivery of your baby. The levels of thyroid hormone and various cortisol steroids also rapidly drop. Other factors that play a role in these mood changes include major life events and genetics.  °RISK FACTORS °If you have any of the following risks for the baby blues or postpartum depression, know what symptoms to watch out for during the postpartum period. Risk factors that may increase the likelihood of getting the baby blues or postpartum depression include: °· Having a personal or family history of depression.   °· Having depression while being pregnant.   °· Having premenstrual mood issues or mood issues related to oral contraceptives. °· Having a lot of life stress.   °· Having marital conflict.   °· Lacking a social support network.   °· Having a baby with special needs.   °· Having health problems, such as diabetes.   °SIGNS AND SYMPTOMS °Symptoms of baby blues  include: °· Brief changes in mood, such as going from extreme happiness to sadness. °· Decreased concentration.   °· Difficulty sleeping.   °· Crying spells, tearfulness.   °· Irritability.   °· Anxiety.   °Symptoms of postpartum depression typically begin within the first month after giving birth. These symptoms include: °· Difficulty sleeping or excessive sleepiness.   °· Marked weight loss.   °· Agitation.   °· Feelings of worthlessness.   °· Lack of interest in activity or food.   °Postpartum psychosis is a very serious condition and can be dangerous. Fortunately, it is rare. Displaying any of the following symptoms is cause for immediate medical attention. Symptoms of postpartum psychosis include:  °· Hallucinations and delusions.   °· Bizarre or disorganized behavior.   °· Confusion or disorientation.   °DIAGNOSIS  °A diagnosis is made by an evaluation of your symptoms. There are no medical or lab tests that lead to a diagnosis, but there are various questionnaires that a health care provider may use to identify those with the baby blues, postpartum depression, or psychosis. Often, a screening tool called the Edinburgh Postnatal Depression Scale is used to diagnose depression in the postpartum period.  °TREATMENT °The baby blues usually goes away on its own in 1-2 weeks. Social support is often all that is needed. You will be encouraged to get adequate sleep and rest. Occasionally, you may be given medicines to help you sleep.  °Postpartum depression requires treatment because it can last several months or longer if it is not treated. Treatment may include individual or group therapy, medicine, or both to address any social, physiological, and psychological   factors that may play a role in the depression. Regular exercise, a healthy diet, rest, and social support may also be strongly recommended.  °Postpartum psychosis is more serious and needs treatment right away. Hospitalization is often needed. °HOME CARE  INSTRUCTIONS °· Get as much rest as you can. Nap when the baby sleeps.   °· Exercise regularly. Some women find yoga and walking to be beneficial.   °· Eat a balanced and nourishing diet.   °· Do little things that you enjoy. Have a cup of tea, take a bubble bath, read your favorite magazine, or listen to your favorite music. °· Avoid alcohol.   °· Ask for help with household chores, cooking, grocery shopping, or running errands as needed. Do not try to do everything.   °· Talk to people close to you about how you are feeling. Get support from your partner, family members, friends, or other new moms. °· Try to stay positive in how you think. Think about the things you are grateful for.   °· Do not spend a lot of time alone.   °· Only take over-the-counter or prescription medicine as directed by your health care provider. °· Keep all your postpartum appointments.   °· Let your health care provider know if you have any concerns.   °SEEK MEDICAL CARE IF: °You are having a reaction to or problems with your medicine. °SEEK IMMEDIATE MEDICAL CARE IF: °· You have suicidal feelings.   °· You think you may harm the baby or someone else. °MAKE SURE YOU: °· Understand these instructions. °· Will watch your condition. °· Will get help right away if you are not doing well or get worse. °  °This information is not intended to replace advice given to you by your health care provider. Make sure you discuss any questions you have with your health care provider. °  °Document Released: 09/02/2004 Document Revised: 12/04/2013 Document Reviewed: 09/10/2013 °Elsevier Interactive Patient Education ©2016 [employer] ° °

## 2016-08-30 NOTE — Addendum Note (Signed)
Addended by: Gerome ApleyZEYFANG, LINDA L on: 08/30/2016 05:10 PM   Modules accepted: Orders

## 2016-08-30 NOTE — BH Specialist Note (Signed)
  ASSESSMENT: Pt currently experiencing Generalized anxiety disorder. [facility] Pt would benefit from psychoeducation and brief therapeutic intervention regarding coping with symptoms of anxiety and depression Stage of Change: contemplative  PLAN: 1. F/U with behavioral health clinician in two weeks 2. Psychiatric Medications: Lexapro, increase dosage today 3. Behavioral recommendations:   -Read educational material regarding coping with symptoms of anxiety (and depression) -Practice relaxation breathing exercise daily -Consider additional apps for anxiety, as discussed in office visit  SUBJECTIVE: Pt. referred by Jen MowElizabeth Mumaw, MD, for symptoms of anxiety Pt. reports the following symptoms/concerns: Pt states that she has felt an increase in anxiety since birth of 74mo, began after birth of 20yo, meds are "not enough"; open to additional coping strategies Duration of problem: Over two years Severity: moderate  OBJECTIVE: Orientation & Cognition: Oriented x3. Thought processes normal and appropriate to situation. Mood: anxious Affect: appropriate Appearance: appropriate Risk of harm to self or others: no known risk of harm to self or others Substance use: none Assessments administered: PHQ9: 12/ GAD7: 8  Diagnosis: Generalized anxiety disorder CPT Code: F41.1  -------------------------------------------- Other(s) present in the room: none  Time spent with patient in exam room: 30 minutes, 4:30-5:00pm  Depression screen PHQ 2/9 08/30/2016  Decreased Interest 1  Down, Depressed, Hopeless 1  PHQ - 2 Score 2  Altered sleeping 3  Tired, decreased energy 3  Change in appetite 2  Feeling bad or failure about yourself  0  Trouble concentrating 1  Moving slowly or fidgety/restless 1  Suicidal thoughts 0  PHQ-9 Score 12   GAD 7 : Generalized Anxiety Score 08/30/2016  Nervous, Anxious, on Edge 1  Control/stop worrying 1  Worry too much - different things 1   Trouble relaxing 1  Restless 1  Easily annoyed or irritable 2  Afraid - awful might happen 1  Total GAD 7 Score 8

## 2016-09-23 ENCOUNTER — Ambulatory Visit (INDEPENDENT_AMBULATORY_CARE_PROVIDER_SITE_OTHER): Payer: BLUE CROSS/BLUE SHIELD | Admitting: Family Medicine

## 2016-09-23 ENCOUNTER — Encounter: Payer: Self-pay | Admitting: Family Medicine

## 2016-09-23 VITALS — BP 119/92 | HR 93 | Wt 128.1 lb

## 2016-09-23 DIAGNOSIS — Z30430 Encounter for insertion of intrauterine contraceptive device: Secondary | ICD-10-CM

## 2016-09-23 DIAGNOSIS — Z3202 Encounter for pregnancy test, result negative: Secondary | ICD-10-CM

## 2016-09-23 DIAGNOSIS — E038 Other specified hypothyroidism: Secondary | ICD-10-CM

## 2016-09-23 LAB — POCT PREGNANCY, URINE: Preg Test, Ur: NEGATIVE

## 2016-09-23 LAB — TSH: TSH: 1.16 m[IU]/L (ref 0.50–4.30)

## 2016-09-23 MED ORDER — LEVONORGESTREL 18.6 MCG/DAY IU IUD
INTRAUTERINE_SYSTEM | Freq: Once | INTRAUTERINE | Status: AC
  Administered 2016-09-23: 1 via INTRAUTERINE

## 2016-09-23 MED ORDER — IBUPROFEN 200 MG PO TABS
800.0000 mg | ORAL_TABLET | Freq: Once | ORAL | Status: AC
  Administered 2016-09-23: 800 mg via ORAL

## 2016-09-23 NOTE — Progress Notes (Signed)
IUD Procedure Note Patient identified, informed consent performed, signed copy in chart, time out was performed.  Urine pregnancy test negative.  Speculum placed in the vagina.  Cervix visualized.  Cleaned with Betadine x 2.  Grasped anteriorly with a single tooth tenaculum.  Uterus sounded to 7.5cm.  Mirena IUD placed per manufacturer's recommendations.  Strings trimmed to 3 cm. Tenaculum was removed, good hemostasis noted.  Patient tolerated procedure well.   Patient given post procedure instructions and Mirena care card with expiration date.  Patient is asked to check IUD strings periodically and follow up in 4-6 weeks for IUD check.   Shortly after insertion patient was complaining of abdominal pain/cramping and feeling nauseous. Strings were visualized. A bedside ultrasound was performed by Dr. Nettie ElmMichael Ervin, and confirmed appropriate placement of IUD in endometrial cavity (picture printed and scanned into chart). Within 30 min, cramping resolved and patient felt comfortable going home.    Cleda ClarksElizabeth W. Mumaw, DO  [facility] Fellow [facility]

## 2016-09-27 NOTE — Patient Instructions (Signed)

## 2016-09-29 ENCOUNTER — Telehealth: Payer: Self-pay | Admitting: *Deleted

## 2016-09-29 NOTE — Telephone Encounter (Addendum)
Pt left message requesting thyroid test results from last week.   10/23  Called pt and informed her of TSH results. She voiced understanding and stated that she was concerned because her hair has been falling out. I advised this can be normal after having a baby.  She should call back if this is still a problem after 1 month.  Pt voiced understanding.

## 2016-10-11 IMAGING — US US MFM OB FOLLOW-UP
1 series · 14 of 28 positions shown · non-contrast
Comparison: none

[Series 1: us mfm ob follow-up · 14 of 50 slices shown]
[im 2/50]
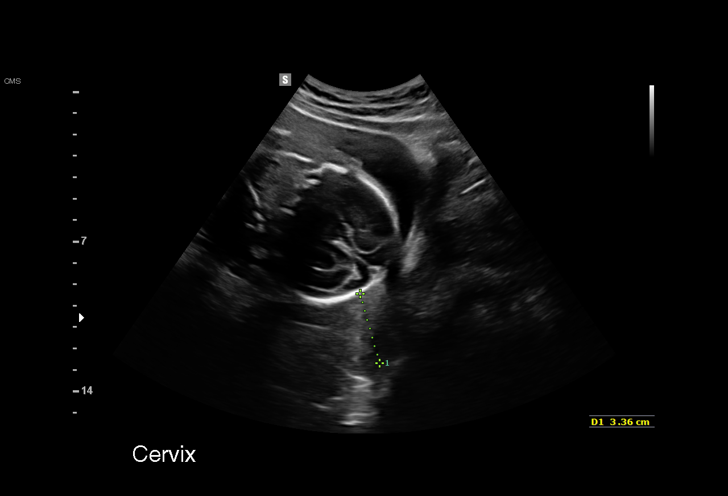
[im 6/50]
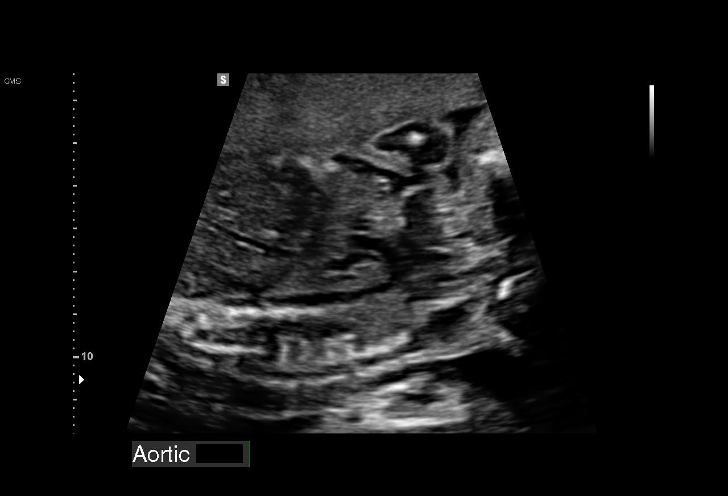
[im 10/50]
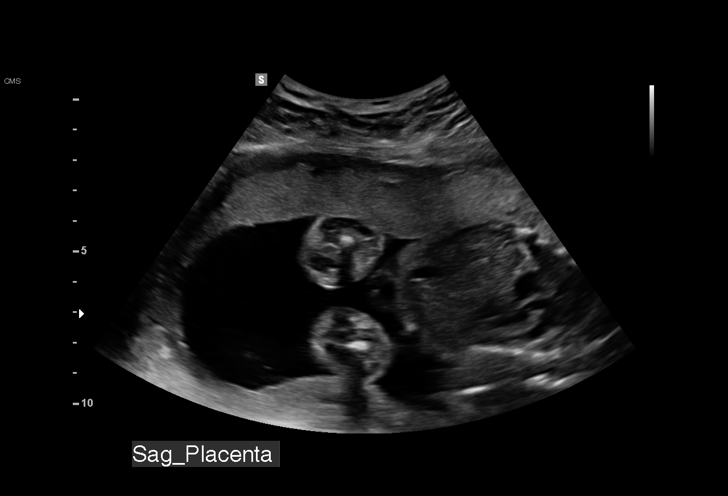
[im 13/50]
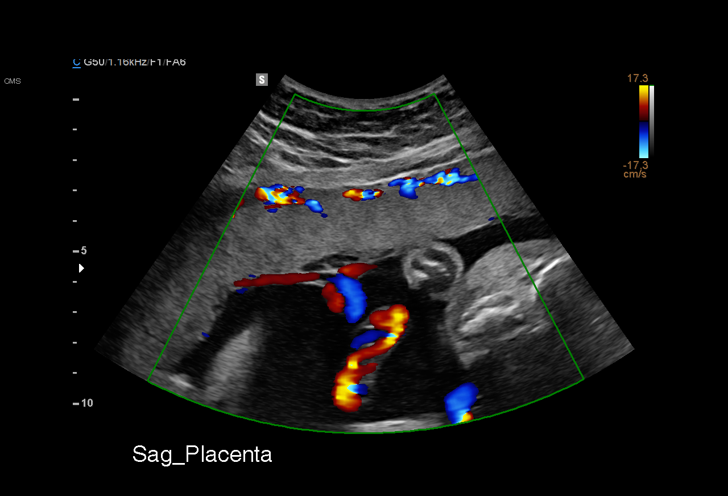
[im 17/50]
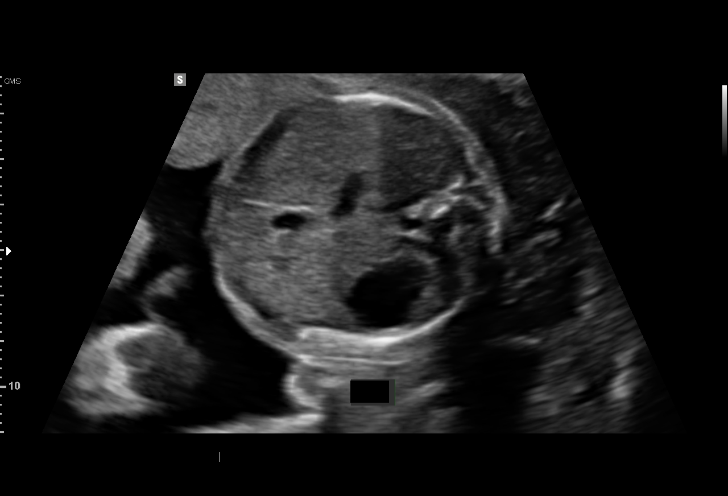
[im 20/50]
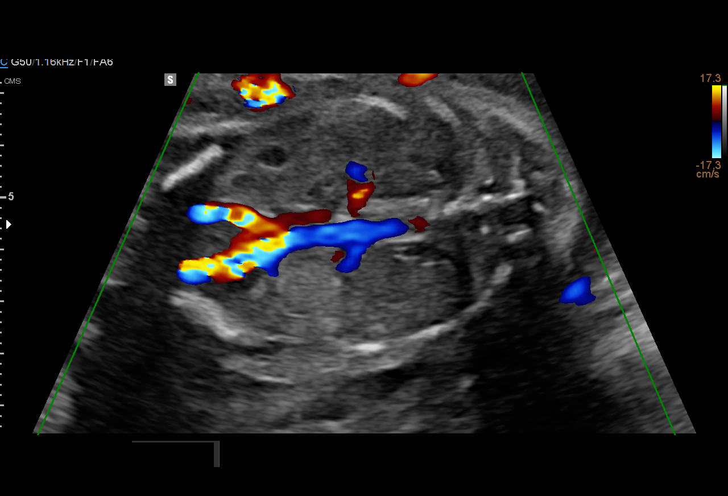
[im 24/50]
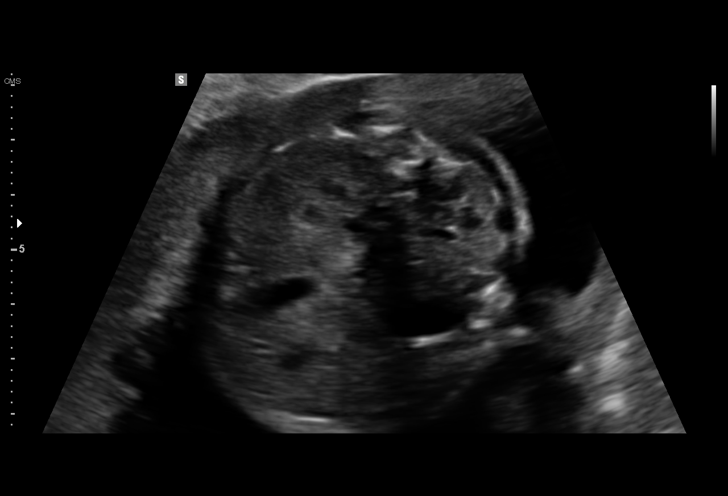
[im 28/50]
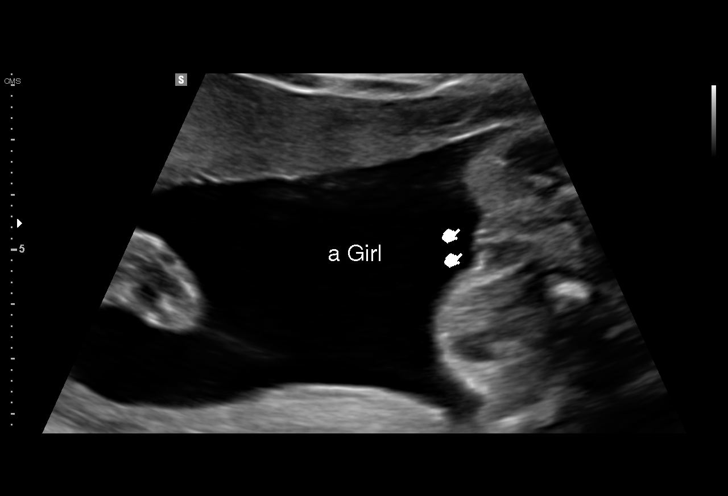
[im 31/50]
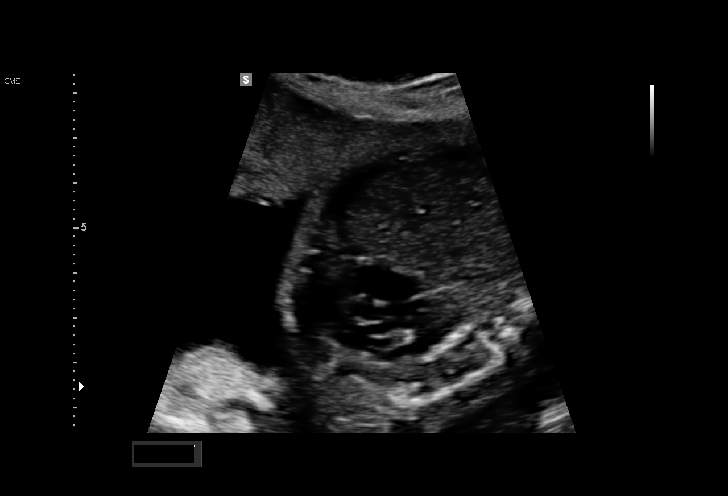
[im 35/50]
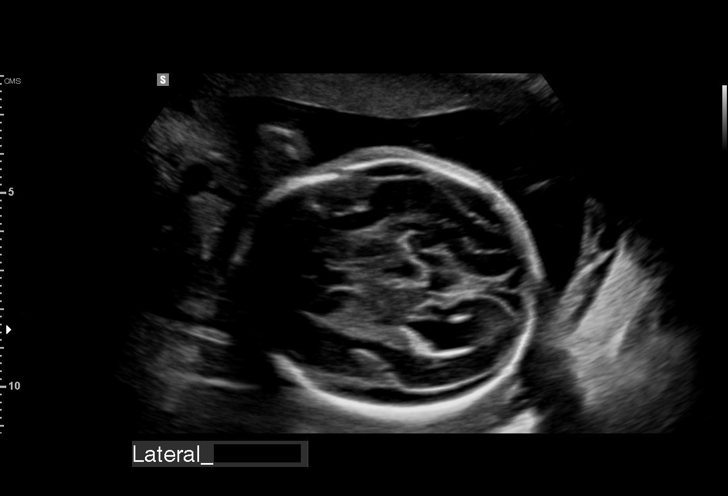
[im 39/50]
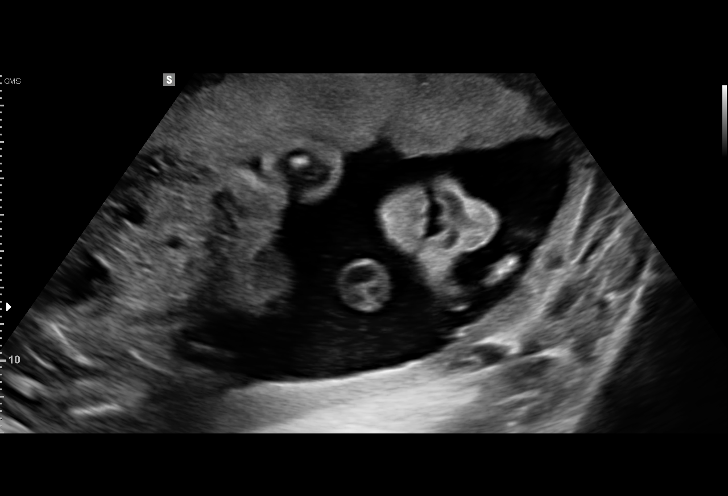
[im 42/50]
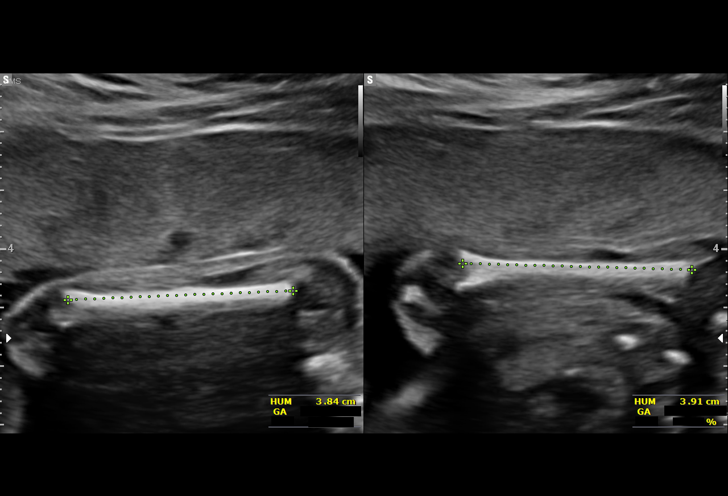
[im 46/50]
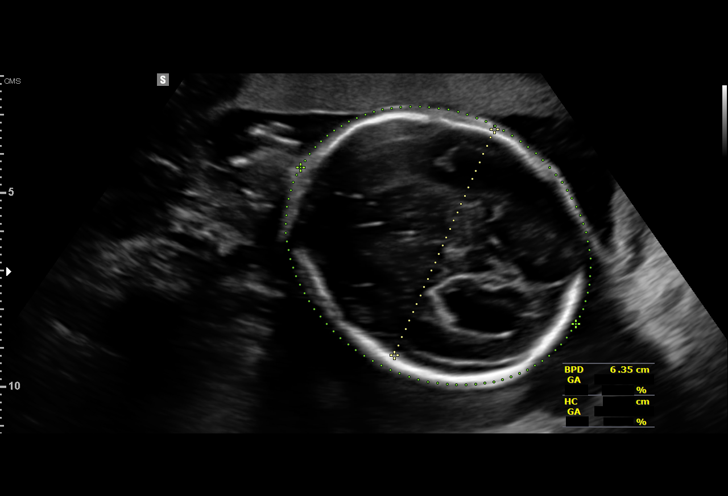
[im 50/50]
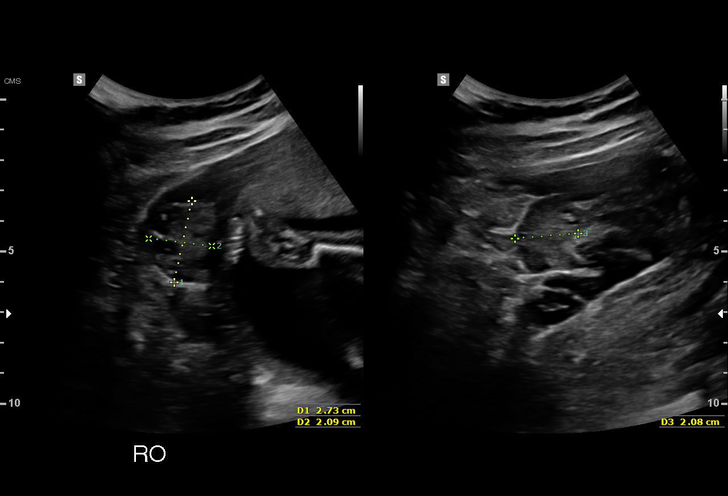

[14 of 28 positions shown; findings below may reference images not displayed]

Hospital Clinic-
Faculty Physician
OB/Gyn Clinic
Ref. Address:     [REDACTED]

Indications

24 weeks gestation of pregnancy
Smoking complicating pregnancy, second
trimester
Poor obstetric history: Previous preterm
delivery, antepartum
Hypothyroid
Other mental disorder complicating
pregnancy, second trimester
Follow-up incomplete fetal anatomic            [id number]
evaluation
OB History

Gravidity:    2         Term:   0        Prem:   1         SAB:   0
TOP:          0       Ectopic:  0        Living: 1
Fetal Evaluation

Num Of Fetuses:     1
Cardiac Activity:   Observed
Presentation:       Cephalic
Placenta:           Anterior, above cervical os
P. Cord Insertion:  Visualized, central
Amniotic Fluid
AFI FV:      Subjectively within normal limits

Largest Pocket(cm)
4.83
Biometry

BPD:      63.8  mm     G. Age:  25w 6d         83  %    CI:         74.56  %    70 - 86
FL/HC:       18.0  %    18.7 -
HC:      234.5  mm     G. Age:  25w 3d         64  %    HC/AC:       1.14       1.04 -
AC:      205.5  mm     G. Age:  25w 1d         59  %    FL/BPD:      66.1  %    71 - 87
FL:       42.2  mm     G. Age:  23w 5d         16  %    FL/AC:       20.5  %    20 - 24
HUM:      38.7  mm     G. Age:  23w 5d         23  %

Est. FW:     725   gm    1 lb 10 oz     55  %
Gestational Age

LMP:           27w 6d        Date:  10/01/15                 EDD:    07/07/16
U/S Today:     25w 0d                                        EDD:    07/27/16
Best:          24w 4d     Det. By:  Early Ultrasound         EDD:    07/30/16
(12/27/15)
Anatomy

Cranium:               Appears normal         Aortic Arch:            Appears normal
Cavum:                 Previously seen        Ductal Arch:            Appears normal
Ventricles:            Appears normal         Diaphragm:              Appears normal
Choroid Plexus:        Appears normal         Stomach:                Appears normal, left
sided
Cerebellum:            Appears normal         Abdomen:                Appears normal
Posterior Fossa:       Appears normal         Abdominal Wall:         Previously seen
Nuchal Fold:           Previously seen        Cord Vessels:           Previously seen
Face:                  Appears normal         Kidneys:                Appear normal
(orbits and profile)
Lips:                  Appears normal         Bladder:                Appears normal
Thoracic:              Appears normal         Spine:                  Previously seen
Heart:                 Previously seen        Upper Extremities:      Previously seen
RVOT:                  Appears normal         Lower Extremities:      Previously seen
LVOT:                  Appears normal

Other:  Fetus appears to be a female. Heels and 5th digit previously
visualized. Nasal bone visualized. Technically difficult due to fetal
position.
Cervix Uterus Adnexa

Cervix
Length:           3.36  cm.
Normal appearance by transabdominal scan.

Uterus
No abnormality visualized.

Left Ovary
Within normal limits.

Right Ovary
Within normal limits.
Cul De Sac:   No free fluid seen.
Adnexa:       No abnormality visualized.
Impression

SIUP at 24+4 weeks
Normal interval anatomy; anatomic survey complete
Normal amniotic fluid volume
Appropriate interval growth with EFW at the 55th %tile
Recommendations

Follow-up as clinically indicated

## 2016-10-29 ENCOUNTER — Other Ambulatory Visit: Payer: Self-pay | Admitting: Family Medicine

## 2016-10-29 DIAGNOSIS — F411 Generalized anxiety disorder: Secondary | ICD-10-CM

## 2016-11-27 ENCOUNTER — Other Ambulatory Visit: Payer: Self-pay | Admitting: Family Medicine

## 2016-11-27 DIAGNOSIS — F411 Generalized anxiety disorder: Secondary | ICD-10-CM

## 2016-12-17 ENCOUNTER — Other Ambulatory Visit: Payer: Self-pay | Admitting: Family Medicine

## 2016-12-17 DIAGNOSIS — F411 Generalized anxiety disorder: Secondary | ICD-10-CM

## 2016-12-20 IMAGING — US US MFM OB FOLLOW-UP
1 series · 14 of 28 positions shown · non-contrast
Comparison: none

[Series 1: us mfm ob follow-up · 33 acquisitions, 14 frames shown]
[im 2/33]
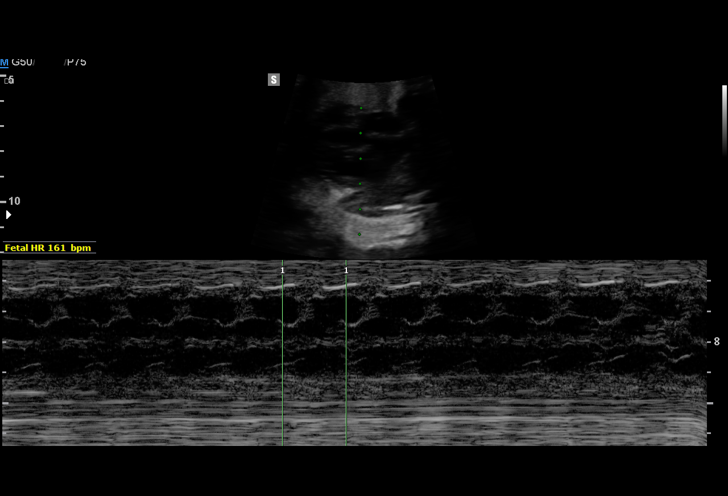
[im 4/33]
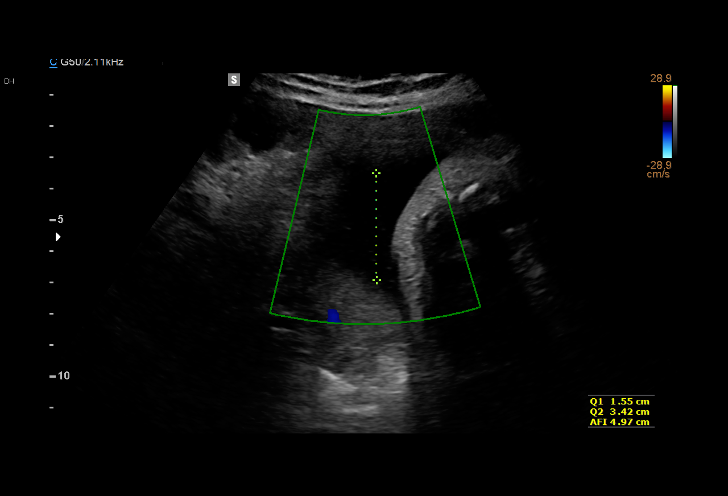
[im 6/33]
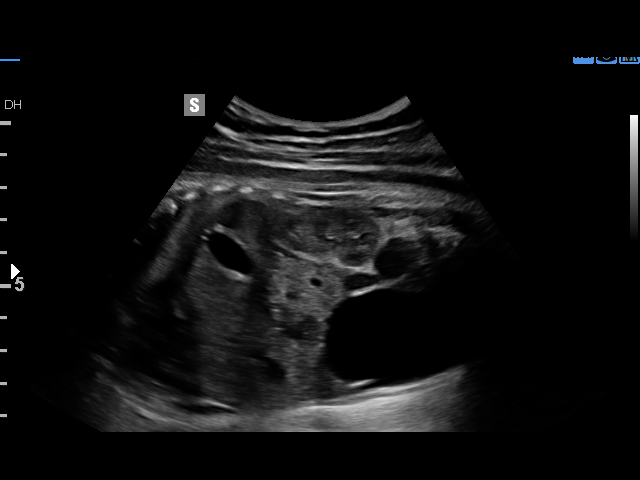
[im 9/33]
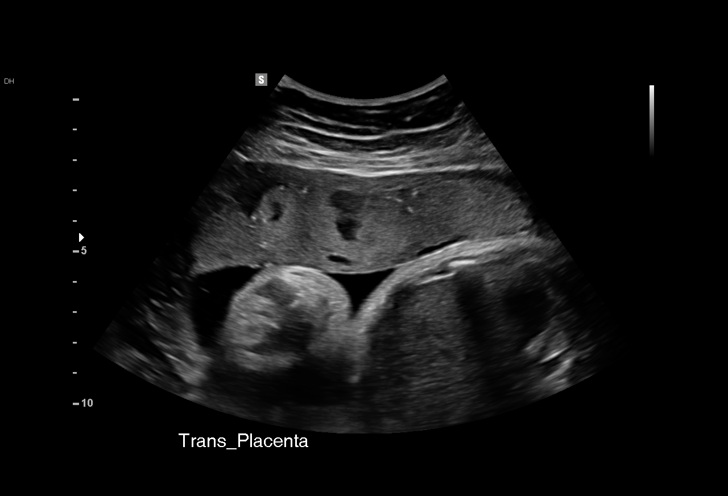
[im 11/33]
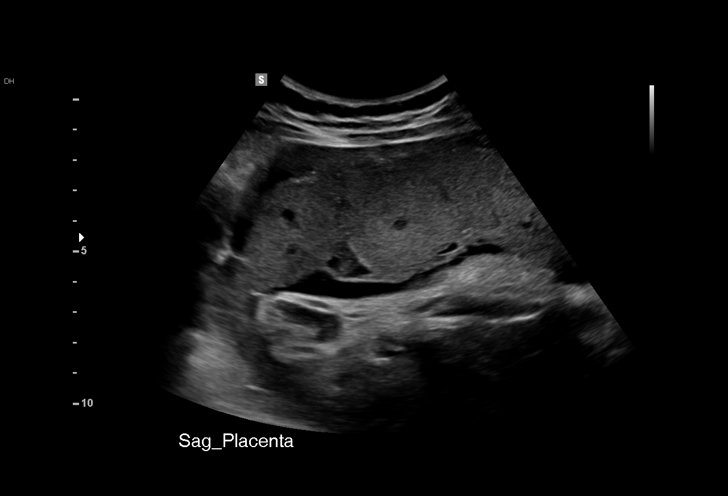
[im 14/33]
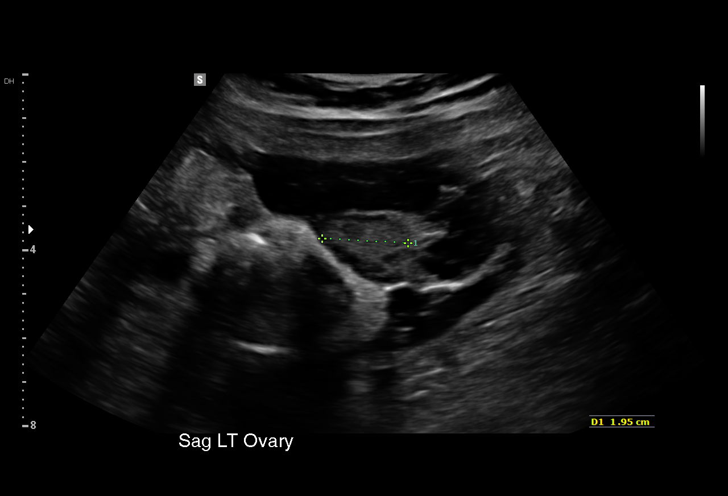
[im 16/33]
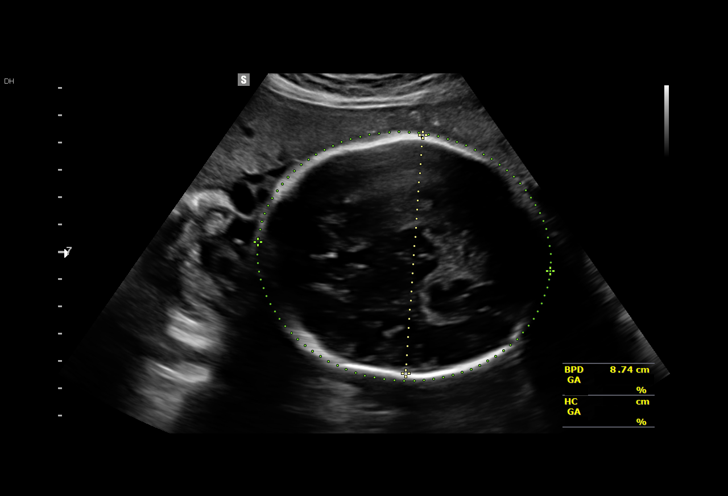
[im 18/33]
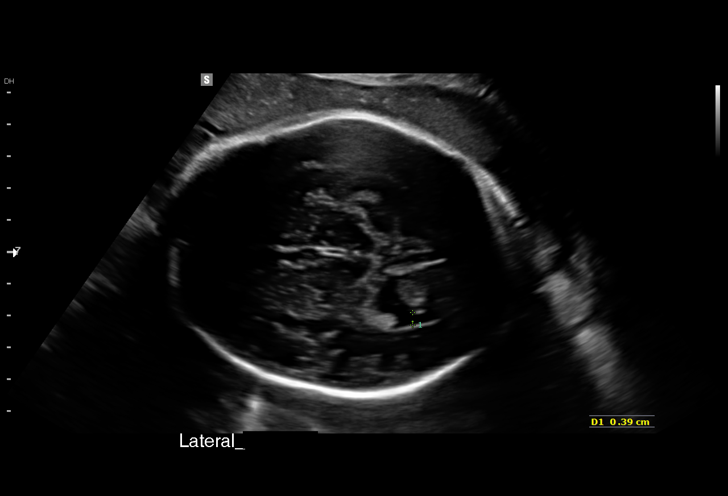
[im 21/33]
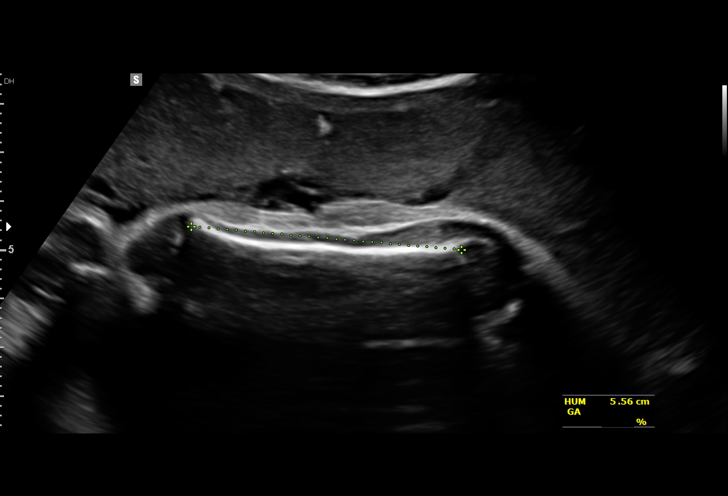
[im 23/33]
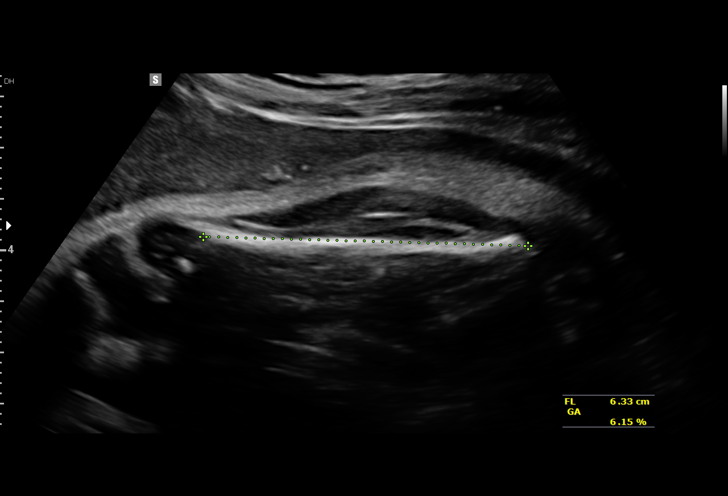
[im 25/33]
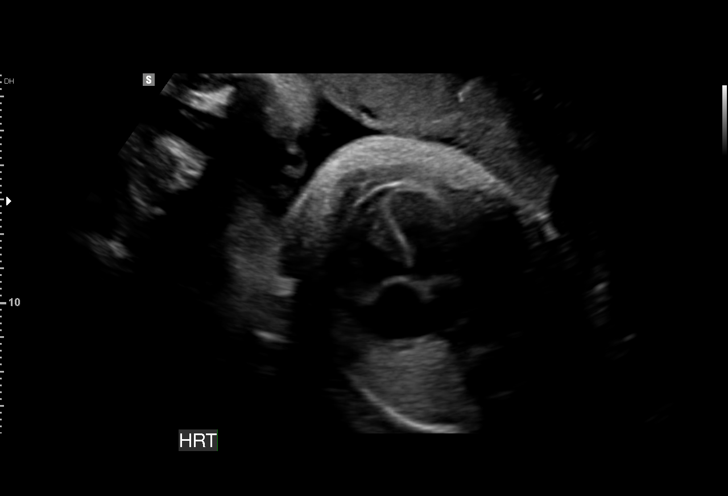
[im 28/33]
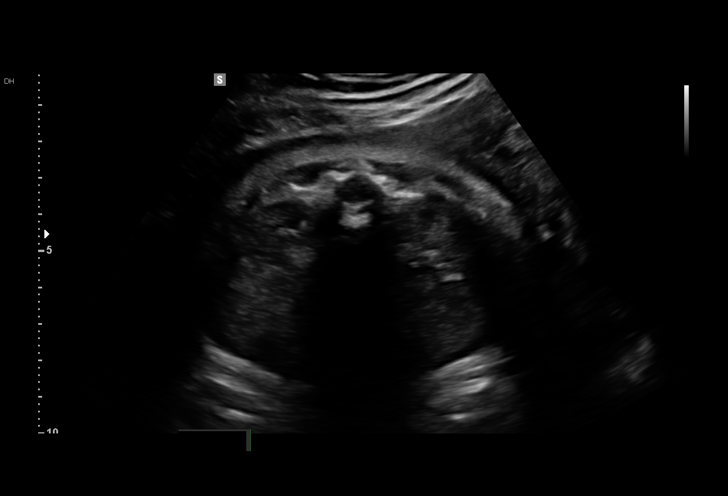
[im 30/33]
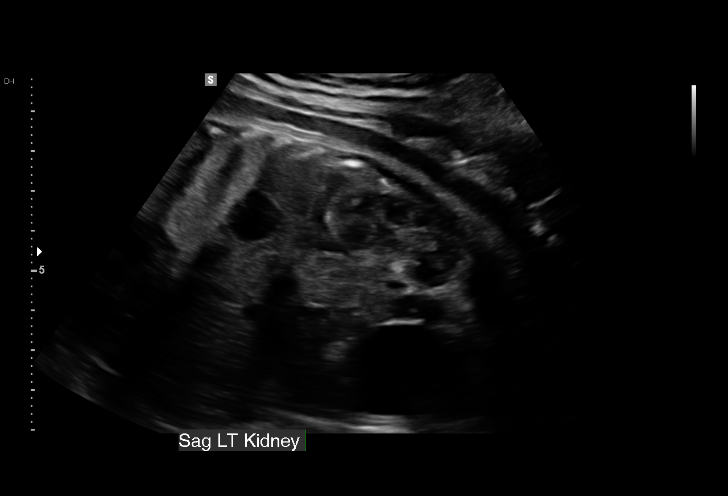
[im 33/33]
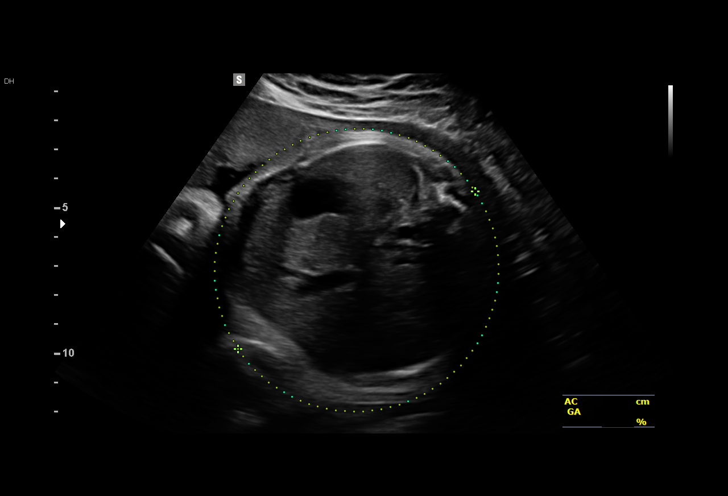

[14 of 28 positions shown; findings below may reference images not displayed]

Hospital Clinic-
Faculty Physician
OB/Gyn Clinic
Ref. Address:     [REDACTED]

1  KIRI JIM            [id number]      [id number]     [id number]
Indications

34 weeks gestation of pregnancy
Smoking complicating pregnancy, third
trimester
Hypothyroid (levothyroxine)
Other mental disorder complicating
pregnancy, third trimester
Poor obstetric history: Previous preterm
delivery (36 weeks, 4lb 11oz)
Poor obstetric history: Previous preeclampsia
OB History

Gravidity:    2         Term:   0        Prem:   1        SAB:   0
TOP:          0       Ectopic:  0        Living: 1
Fetal Evaluation

Num Of Fetuses:     1
Fetal Heart         161
Rate(bpm):
Cardiac Activity:   Observed
Presentation:       Frank breech
Placenta:           Anterior, above cervical os
P. Cord Insertion:  Previously Visualized
Amniotic Fluid
AFI FV:      Subjectively within normal limits

AFI Sum(cm)     %Tile       Largest Pocket(cm)
10.16           21

RUQ(cm)       RLQ(cm)       LUQ(cm)        LLQ(cm)
1.55
Biometry

BPD:        87  mm     G. Age:  35w 1d         66  %    CI:           78   %   70 - 86
FL/HC:      20.3   %   20.1 -
HC:      311.7  mm     G. Age:  34w 6d         23  %    HC/AC:      1.03       0.93 -
AC:      303.7  mm     G. Age:  34w 3d         48  %    FL/BPD:     72.8   %   71 - 87
FL:       63.3  mm     G. Age:  32w 5d          7  %    FL/AC:      20.8   %   20 - 24
HUM:      55.6  mm     G. Age:  32w 3d         17  %

Est. FW:    0221  gm      5 lb 2 oz     49  %
Gestational Age

LMP:           37w 6d       Date:   10/01/15                 EDD:   07/07/16
U/S Today:     34w 2d                                        EDD:   08/01/16
Best:          34w 4d    Det. By:   Early Ultrasound         EDD:   07/30/16
(12/27/15)
Anatomy

Cranium:               Appears normal         Aortic Arch:            Previously seen
Cavum:                 Appears normal         Ductal Arch:            Previously seen
Ventricles:            Appears normal         Diaphragm:              Previously seen
Choroid Plexus:        Previously seen        Stomach:                Appears normal, left
sided
Cerebellum:            Previously seen        Abdomen:                Appears normal
Posterior Fossa:       Previously seen        Abdominal Wall:         Previously seen
Nuchal Fold:           Previously seen        Cord Vessels:           Previously seen
Face:                  Orbits and profile     Kidneys:                Appear normal
previously seen
Lips:                  Previously seen        Bladder:                Appears normal
Thoracic:              Appears normal         Spine:                  Previously seen
Heart:                 Appears normal         Upper Extremities:      Previously seen
(4CH, axis, and situs
RVOT:                  Previously seen        Lower Extremities:      Previously seen
LVOT:                  Previously seen

Other:  Female gender previously seen. Heels and 5th digit previously
visualized. Nasal bone previously visualized. Technically difficult due
to fetal position.
Cervix Uterus Adnexa

Cervix
Not visualized (advanced GA >81wks)

Left Ovary
Within normal limits.

Right Ovary
Within normal limits.

Adnexa:       No abnormality visualized.
Impression

Single IUP at 34w 4d
Tobacco use, hypothyroidism
Interval fetal growth is appropriate (49th %tile)
Frank Breech presentation
Normal amniotic fluid volume
Recommendations

Would check presentation at 
 36 weeks and offer external
cephalic version if appropriate.
Follow-up ultrasounds as clinically indicated.
